# Patient Record
Sex: Female | Born: 1970 | Race: White | Hispanic: No | Marital: Single | Smoking: Former smoker
Health system: Southern US, Community
[De-identification: ages and names within clinical notes are randomized; demographics above are authoritative.]

## PROBLEM LIST (undated history)

## (undated) DIAGNOSIS — Z8719 Personal history of other diseases of the digestive system: Secondary | ICD-10-CM

## (undated) DIAGNOSIS — S199XXA Unspecified injury of neck, initial encounter: Secondary | ICD-10-CM

## (undated) DIAGNOSIS — Z8711 Personal history of peptic ulcer disease: Secondary | ICD-10-CM

## (undated) DIAGNOSIS — S92919A Unspecified fracture of unspecified toe(s), initial encounter for closed fracture: Secondary | ICD-10-CM

## (undated) HISTORY — PX: KNEE ARTHROSCOPY: SHX127

## (undated) HISTORY — PX: BREAST SURGERY: SHX581

## (undated) HISTORY — PX: WISDOM TOOTH EXTRACTION: SHX21

---

## 2006-03-25 ENCOUNTER — Inpatient Hospital Stay (HOSPITAL_COMMUNITY): Admission: AD | Admit: 2006-03-25 | Discharge: 2006-03-25 | Payer: Self-pay | Admitting: Gynecology

## 2008-11-12 ENCOUNTER — Emergency Department (HOSPITAL_COMMUNITY): Admission: EM | Admit: 2008-11-12 | Discharge: 2008-11-12 | Payer: Self-pay | Admitting: Emergency Medicine

## 2008-11-18 ENCOUNTER — Ambulatory Visit (HOSPITAL_COMMUNITY): Admission: RE | Admit: 2008-11-18 | Discharge: 2008-11-18 | Payer: Self-pay | Admitting: Emergency Medicine

## 2008-11-18 ENCOUNTER — Emergency Department (HOSPITAL_COMMUNITY): Admission: EM | Admit: 2008-11-18 | Discharge: 2008-11-18 | Payer: Self-pay | Admitting: Emergency Medicine

## 2008-11-18 ENCOUNTER — Ambulatory Visit: Payer: Self-pay | Admitting: Surgery

## 2012-09-17 ENCOUNTER — Encounter (HOSPITAL_COMMUNITY): Payer: Self-pay | Admitting: Emergency Medicine

## 2012-09-17 ENCOUNTER — Emergency Department (HOSPITAL_COMMUNITY)
Admission: EM | Admit: 2012-09-17 | Discharge: 2012-09-17 | Disposition: A | Discharge status: Home / Self Care | Payer: Medicaid Other | Attending: Emergency Medicine | Admitting: Emergency Medicine

## 2012-09-17 DIAGNOSIS — F17200 Nicotine dependence, unspecified, uncomplicated: Secondary | ICD-10-CM | POA: Insufficient documentation

## 2012-09-17 DIAGNOSIS — Y9389 Activity, other specified: Secondary | ICD-10-CM | POA: Insufficient documentation

## 2012-09-17 DIAGNOSIS — R519 Headache, unspecified: Secondary | ICD-10-CM

## 2012-09-17 DIAGNOSIS — Y9289 Other specified places as the place of occurrence of the external cause: Secondary | ICD-10-CM | POA: Insufficient documentation

## 2012-09-17 DIAGNOSIS — X503XXA Overexertion from repetitive movements, initial encounter: Secondary | ICD-10-CM | POA: Insufficient documentation

## 2012-09-17 DIAGNOSIS — S139XXA Sprain of joints and ligaments of unspecified parts of neck, initial encounter: Secondary | ICD-10-CM | POA: Insufficient documentation

## 2012-09-17 DIAGNOSIS — S161XXA Strain of muscle, fascia and tendon at neck level, initial encounter: Secondary | ICD-10-CM

## 2012-09-17 DIAGNOSIS — G43909 Migraine, unspecified, not intractable, without status migrainosus: Secondary | ICD-10-CM | POA: Insufficient documentation

## 2012-09-17 MED ORDER — SODIUM CHLORIDE 0.9 % IV BOLUS (SEPSIS)
2000.0000 mL | Freq: Once | INTRAVENOUS | Status: AC
  Administered 2012-09-17: 1000 mL via INTRAVENOUS

## 2012-09-17 MED ORDER — PREDNISONE 50 MG PO TABS: 50.0000 mg | ORAL_TABLET | Freq: Every day | ORAL | Status: DC

## 2012-09-17 MED ORDER — PROCHLORPERAZINE EDISYLATE 5 MG/ML IJ SOLN
10.0000 mg | Freq: Once | INTRAMUSCULAR | Status: AC
  Administered 2012-09-17: 10 mg via INTRAVENOUS
  Filled 2012-09-17: qty 2

## 2012-09-17 MED ORDER — KETOROLAC TROMETHAMINE 30 MG/ML IJ SOLN
30.0000 mg | Freq: Once | INTRAMUSCULAR | Status: AC
  Administered 2012-09-17: 30 mg via INTRAVENOUS
  Filled 2012-09-17: qty 1

## 2012-09-17 MED ORDER — CYCLOBENZAPRINE HCL 10 MG PO TABS: 10.0000 mg | ORAL_TABLET | Freq: Three times a day (TID) | ORAL | Status: DC | PRN

## 2012-09-17 MED ORDER — DIPHENHYDRAMINE HCL 50 MG/ML IJ SOLN
25.0000 mg | Freq: Once | INTRAMUSCULAR | Status: AC
  Administered 2012-09-17: 25 mg via INTRAVENOUS
  Filled 2012-09-17: qty 1

## 2012-09-17 MED ORDER — HYDROCODONE-ACETAMINOPHEN 5-325 MG PO TABS: 1.0000 | ORAL_TABLET | Freq: Four times a day (QID) | ORAL | Status: DC | PRN

## 2012-09-17 NOTE — ED Provider Notes (Signed)
CSN: [id number]     Arrival date & time 09/17/12  1843 History     First MD Initiated Contact with Patient 09/17/12 2044     Chief Complaint  Patient presents with  . Migraine  . Torticollis   (Consider location/radiation/quality/duration/timing/severity/associated sxs/prior Treatment) HPI Patient presents to the emergency department with headache and neck pain for the last 3 weeks.  The patient, states, that she was moving and surgery carrying boxes on her head.  She states that she had developing neck pain over the next few days.  Patient denies numbness, weakness, chest pain, shortness of breath, fever, blurred vision, nausea, vomiting, diarrhea, fever, or syncope.  Patient, states she does not take any medications prior to arrival.  Patient, states the headache starts in her neck and radiates around to her head. History reviewed. No pertinent past medical history. Past Surgical History  Procedure Laterality Date  . Knee arthroscopy     No family history on file. History  Substance Use Topics  . Smoking status: Current Every Day Smoker    Types: Cigarettes  . Smokeless tobacco: Not on file  . Alcohol Use: No   OB History   Grav Para Term Preterm Abortions TAB SAB Ect Mult Living                 Review of Systems All other systems negative except as documented in the HPI. All pertinent positives and negatives as reviewed in the HPI. Allergies  Flagyl  Home Medications   Current Outpatient Rx  Name  Route  Sig  Dispense  Refill  . acetaminophen (TYLENOL) 500 MG tablet   Oral   Take 1,500 mg by mouth every 6 (six) hours as needed for pain.          BP 108/77  Pulse 94  Temp(Src) 97.8 F (36.6 C) (Oral)  Resp 16  SpO2 100% Physical Exam  Nursing note and vitals reviewed. Constitutional: She is oriented to person, place, and time. She appears well-developed and well-nourished.  HENT:  Head: Normocephalic and atraumatic.  Mouth/Throat: Oropharynx is clear and  moist.  Eyes: Pupils are equal, round, and reactive to light.  Neck: Normal range of motion. Neck supple.  Cardiovascular: Normal rate, regular rhythm and normal heart sounds.  Exam reveals no gallop and no friction rub.   No murmur heard. Pulmonary/Chest: Effort normal and breath sounds normal. No respiratory distress.  Abdominal: Soft. Bowel sounds are normal. She exhibits no distension. There is no tenderness. There is no rebound.  Neurological: She is alert and oriented to person, place, and time. She has normal strength and normal reflexes. No sensory deficit. She exhibits normal muscle tone. Coordination and gait normal.  Reflex Scores:      Tricep reflexes are 2+ on the right side and 2+ on the left side.      Bicep reflexes are 2+ on the right side and 2+ on the left side. Skin: Skin is warm and dry. No rash noted. No erythema.    ED Course   Procedures (including critical care time) Patient has cervical strain and tension type headache, based on her history of present illness and physical exam findings.  Patient has no neurological deficits noted on exam.  She has normal neurological exam and reflexes in all 4 extremities.  Patient is advised to followup with her primary Dr. told to return here as needed.  For any worsening in her condition  MDM    Carlyle Dolly, PA-C  09/20/12 0716 

## 2012-09-17 NOTE — ED Notes (Signed)
Pt c/o of neck stiffness, migraine, nausea, vomiting. States that she was carrying boxes on her head while moving and since has had pain. Pain 10/10. Photosensitivity.

## 2012-09-17 NOTE — ED Notes (Signed)
Pt has been moving to a second story apartment for the past few days and she has been carrying boxes on her head. Pt states she has knots on her neck and on top of her shoulders. Pt has been having this migraine feeling the past few nights.

## 2012-09-26 NOTE — ED Provider Notes (Signed)
Medical screening examination/treatment/procedure(s) were performed by non-physician practitioner and as supervising physician I was immediately available for consultation/collaboration.  Romey Cohea N Dametrius Sanjuan, DO 09/26/12 1055 

## 2012-10-14 ENCOUNTER — Other Ambulatory Visit: Payer: Self-pay | Admitting: Family Medicine

## 2012-10-14 DIAGNOSIS — M797 Fibromyalgia: Secondary | ICD-10-CM

## 2012-10-14 DIAGNOSIS — M542 Cervicalgia: Secondary | ICD-10-CM

## 2012-10-15 ENCOUNTER — Telehealth: Payer: Self-pay | Admitting: Radiology

## 2012-10-15 NOTE — Telephone Encounter (Signed)
Patient has been ordered a CT scan by Dr Milus Glazier, there is nothing in the computer for her. She is a patient at his other office. The information regarding precert was sent to our office, I have forwarded this to the office on [facility] [address]. To you FYI

## 2012-10-16 ENCOUNTER — Inpatient Hospital Stay: Admission: RE | Admit: 2012-10-16 | Payer: Self-pay | Source: Ambulatory Visit

## 2012-10-17 ENCOUNTER — Other Ambulatory Visit: Payer: Self-pay

## 2012-10-21 ENCOUNTER — Other Ambulatory Visit: Payer: Self-pay | Admitting: Family Medicine

## 2012-10-22 ENCOUNTER — Other Ambulatory Visit: Payer: Medicaid Other

## 2012-10-23 ENCOUNTER — Other Ambulatory Visit: Payer: Medicaid Other

## 2012-11-16 ENCOUNTER — Emergency Department (HOSPITAL_COMMUNITY)
Admission: EM | Admit: 2012-11-16 | Discharge: 2012-11-16 | Disposition: A | Discharge status: Home / Self Care | Payer: Medicaid Other | Source: Home / Self Care | Attending: Family Medicine | Admitting: Family Medicine

## 2012-11-16 ENCOUNTER — Emergency Department (INDEPENDENT_AMBULATORY_CARE_PROVIDER_SITE_OTHER): Payer: Medicaid Other

## 2012-11-16 ENCOUNTER — Encounter (HOSPITAL_COMMUNITY): Payer: Self-pay | Admitting: Emergency Medicine

## 2012-11-16 DIAGNOSIS — L089 Local infection of the skin and subcutaneous tissue, unspecified: Secondary | ICD-10-CM

## 2012-11-16 MED ORDER — DOXYCYCLINE HYCLATE 100 MG PO CAPS: 100.0000 mg | ORAL_CAPSULE | Freq: Two times a day (BID) | ORAL | Status: DC

## 2012-11-16 NOTE — ED Notes (Signed)
Pt c/o right hand/pinky inj onset yest around 1700 Reports she jammed her pinky against working [employer] include: swelling, pain Taking tyle and warms soaks w/no relief.

## 2012-11-16 NOTE — ED Notes (Signed)
Applied telfa to finger and wrapped w/1' kling and 1' coban... Buddy taped pinky w/ring finger.

## 2012-11-16 NOTE — ED Provider Notes (Signed)
CSN: [id number]     Arrival date & time 11/16/12  4098 History   None    Chief Complaint  Patient presents with  . Hand Injury   (Consider location/radiation/quality/duration/timing/severity/associated sxs/prior Treatment) HPI Comments: 42 year old female presents complaining of right pinky finger injury yesterday at 5:00 PM. She was working and jammed her finger against a van. She has pain and swelling, with a constant burning sensation of the finger. She states the pain is so severe that it kept her up all night long. She denies any other injury. Denies any numbness in the finger.  Patient is a 42 y.o. female presenting with hand injury.  Hand Injury Associated symptoms: no fever     History reviewed. No pertinent past medical history. Past Surgical History  Procedure Laterality Date  . Knee arthroscopy     No family history on file. History  Substance Use Topics  . Smoking status: Current Every Day Smoker -- 0.50 packs/day    Types: Cigarettes  . Smokeless tobacco: Not on file  . Alcohol Use: No   OB History   Grav Para Term Preterm Abortions TAB SAB Ect Mult Living                 Review of Systems  Constitutional: Negative for fever and chills.  Eyes: Negative for visual disturbance.  Respiratory: Negative for cough and shortness of breath.   Cardiovascular: Negative for chest pain, palpitations and leg swelling.  Gastrointestinal: Negative for nausea, vomiting and abdominal pain.  Endocrine: Negative for polydipsia and polyuria.  Genitourinary: Negative for dysuria, urgency and frequency.  Musculoskeletal:       See history of present illness  Skin: Negative for rash.  Neurological: Negative for dizziness, weakness and light-headedness.    Allergies  Flagyl  Home Medications   Current Outpatient Rx  Name  Route  Sig  Dispense  Refill  . acetaminophen (TYLENOL) 500 MG tablet   Oral   Take 1,500 mg by mouth every 6 (six) hours as needed for pain.         . cyclobenzaprine (FLEXERIL) 10 MG tablet   Oral   Take 1 tablet (10 mg total) by mouth 3 (three) times daily as needed for muscle spasms.   15 tablet   0   . doxycycline (VIBRAMYCIN) 100 MG capsule   Oral   Take 1 capsule (100 mg total) by mouth 2 (two) times daily.   20 capsule   0   . HYDROcodone-acetaminophen (NORCO/VICODIN) 5-325 MG per tablet   Oral   Take 1 tablet by mouth every 6 (six) hours as needed for pain.   15 tablet   0   . predniSONE (DELTASONE) 50 MG tablet   Oral   Take 1 tablet (50 mg total) by mouth daily.   7 tablet   0    BP 97/67  Pulse 111  Temp(Src) 98.1 F (36.7 C) (Oral)  Resp 18  SpO2 98%  LMP 10/26/2012 Physical Exam  Nursing note and vitals reviewed. Constitutional: She is oriented to person, place, and time. Vital signs are normal. She appears well-developed and well-nourished. No distress.  HENT:  Head: Normocephalic and atraumatic.  Pulmonary/Chest: Effort normal. No respiratory distress.  Musculoskeletal:       Hands: Neurological: She is alert and oriented to person, place, and time. She has normal strength. Coordination normal.  Skin: Skin is warm and dry. No rash noted. She is not diaphoretic.  Psychiatric: She has a normal mood  and affect. Judgment normal.    ED Course  Procedures (including critical care time) Labs Review Labs Reviewed - No data to display Imaging Review Dg Finger Little Right  11/16/2012   CLINICAL DATA:  Injured finger.  EXAM: RIGHT LITTLE FINGER 2+V  COMPARISON:  None.  FINDINGS: The joint spaces are maintained.  No acute fracture or dislocation.  IMPRESSION: No acute bony findings.   Electronically Signed   By: Loralie Champagne M.D.   On: 11/16/2012 11:30    Digital block with a single volar Injection at the base of the little finger with 3 mL of 2% lidocaine with epinephrine. Paronychia drained. A single straight Incision into the distal pad of the finger with an 11 blade, no drainable abscess was  located. Dressed, buddy taped. Followup in 3 days per  MDM   1. Finger infection    Putting patient on oral antibiotics. Ibuprofen for pain as needed. After reviewing the prescription drug database, she has just filled a prescription for 90 Oxycodone 8 days ago, which she was untruthful about.       Graylon Good, PA-C 11/16/12 1227

## 2012-11-18 NOTE — ED Provider Notes (Signed)
Medical screening examination/treatment/procedure(s) were performed by a resident physician or non-physician practitioner and as the supervising physician I was immediately available for consultation/collaboration.  Evan Corey, MD    Evan S Corey, MD 11/18/12 0945 

## 2012-12-05 ENCOUNTER — Other Ambulatory Visit: Payer: Self-pay

## 2012-12-05 ENCOUNTER — Other Ambulatory Visit: Payer: Self-pay | Admitting: Family Medicine

## 2012-12-06 ENCOUNTER — Telehealth: Payer: Self-pay

## 2012-12-06 NOTE — Telephone Encounter (Signed)
Called pharmacy and asked them to send their request for valium to Dr L's other office and gave fax #. Dr L had printed off the Rx through [facility] and this is not our pt. I shredded the Rx. Dr Elbert Ewings, [place] Childes

## 2012-12-14 ENCOUNTER — Encounter (HOSPITAL_COMMUNITY): Payer: Self-pay | Admitting: Emergency Medicine

## 2012-12-14 ENCOUNTER — Emergency Department (HOSPITAL_COMMUNITY)
Admission: EM | Admit: 2012-12-14 | Discharge: 2012-12-14 | Disposition: A | Discharge status: Home / Self Care | Payer: Medicaid Other | Attending: Emergency Medicine | Admitting: Emergency Medicine

## 2012-12-14 DIAGNOSIS — F17200 Nicotine dependence, unspecified, uncomplicated: Secondary | ICD-10-CM | POA: Insufficient documentation

## 2012-12-14 DIAGNOSIS — M549 Dorsalgia, unspecified: Secondary | ICD-10-CM | POA: Insufficient documentation

## 2012-12-14 DIAGNOSIS — Z79899 Other long term (current) drug therapy: Secondary | ICD-10-CM | POA: Insufficient documentation

## 2012-12-14 DIAGNOSIS — IMO0002 Reserved for concepts with insufficient information to code with codable children: Secondary | ICD-10-CM | POA: Insufficient documentation

## 2012-12-14 DIAGNOSIS — Z792 Long term (current) use of antibiotics: Secondary | ICD-10-CM | POA: Insufficient documentation

## 2012-12-14 HISTORY — DX: Personal history of other diseases of the digestive system: Z87.19

## 2012-12-14 HISTORY — DX: Unspecified injury of neck, initial encounter: S19.9XXA

## 2012-12-14 HISTORY — DX: Personal history of peptic ulcer disease: Z87.11

## 2012-12-14 HISTORY — DX: Unspecified fracture of unspecified toe(s), initial encounter for closed fracture: S92.919A

## 2012-12-14 MED ORDER — OXYCODONE-ACETAMINOPHEN 5-325 MG PO TABS: 2.0000 | ORAL_TABLET | ORAL | Status: DC | PRN

## 2012-12-14 MED ORDER — DIAZEPAM 5 MG PO TABS: 5.0000 mg | ORAL_TABLET | Freq: Two times a day (BID) | ORAL | Status: DC

## 2012-12-14 NOTE — ED Provider Notes (Signed)
Medical screening examination/treatment/procedure(s) were performed by non-physician practitioner and as supervising physician I was immediately available for consultation/collaboration.  EKG Interpretation   None          Mateen Franssen B. Bernette Mayers, MD 12/14/12 [phone]

## 2012-12-14 NOTE — ED Provider Notes (Signed)
CSN: [id number]     Arrival date & time 12/14/12  6578 History   None    No chief complaint on file.  (Consider location/radiation/quality/duration/timing/severity/associated sxs/prior Treatment) Patient is a 42 y.o. female presenting with neck pain. The history is provided by the patient. No language interpreter was used.  Neck Pain Pain location:  Generalized neck Quality:  Aching Pain radiates to:  Does not radiate Pain severity:  Severe Pain is:  Unable to specify Onset quality:  Sudden Timing:  Constant Progression:  Worsening Chronicity:  New Relieved by:  Nothing Worsened by:  Nothing tried Ineffective treatments:  None tried   No past medical history on file. Past Surgical History  Procedure Laterality Date  . Knee arthroscopy     No family history on file. History  Substance Use Topics  . Smoking status: Current Every Day Smoker -- 0.50 packs/day    Types: Cigarettes  . Smokeless tobacco: Not on file  . Alcohol Use: No   OB History   Grav Para Term Preterm Abortions TAB SAB Ect Mult Living                 Review of Systems  Musculoskeletal: Positive for neck pain.  All other systems reviewed and are negative.    Allergies  Flagyl  Home Medications   Current Outpatient Rx  Name  Route  Sig  Dispense  Refill  . acetaminophen (TYLENOL) 500 MG tablet   Oral   Take 1,500 mg by mouth every 6 (six) hours as needed for pain.         . cyclobenzaprine (FLEXERIL) 10 MG tablet   Oral   Take 1 tablet (10 mg total) by mouth 3 (three) times daily as needed for muscle spasms.   15 tablet   0   . diazepam (VALIUM) 5 MG tablet      TAKE ONE TABLET BY MOUTH TWICE DAILY AS NEEDED    60 tablet   1   . doxycycline (VIBRAMYCIN) 100 MG capsule   Oral   Take 1 capsule (100 mg total) by mouth 2 (two) times daily.   20 capsule   0   . HYDROcodone-acetaminophen (NORCO/VICODIN) 5-325 MG per tablet   Oral   Take 1 tablet by mouth every 6 (six) hours as  needed for pain.   15 tablet   0   . predniSONE (DELTASONE) 50 MG tablet   Oral   Take 1 tablet (50 mg total) by mouth daily.   7 tablet   0    LMP 10/26/2012 Physical Exam  Nursing note and vitals reviewed. Constitutional: She appears well-developed and well-nourished.  HENT:  Head: Normocephalic and atraumatic.  Eyes: Conjunctivae are normal. Pupils are equal, round, and reactive to light.  Neck: Normal range of motion. Neck supple.  Cardiovascular: Normal rate.   Pulmonary/Chest: Effort normal.  Abdominal: Soft. Bowel sounds are normal.  Musculoskeletal: Normal range of motion.  Neurological: She is alert.  Skin: Skin is warm.    ED Course  Procedures (including critical care time) Labs Review Labs Reviewed - No data to display Imaging Review No results found.  EKG Interpretation   None       MDM  No diagnosis found.  Valium  Percocet    Pt given small supply and advised to see her MD for recheck  Elson Areas, PA-C 12/14/12 0720  Lonia Skinner Coats, [place] 12/14/12 [phone]

## 2012-12-14 NOTE — ED Notes (Addendum)
Pt from home with c/o of neck and head pain since this past July requesting pain relief.  Pt has run out of pain medication and has been back and forth between her PCP and orthopedist to help with her pain.  She has a MRI scheduled in 2 weeks with likely surgery.  Increasing bilateral leg weakness. Pt in NAD, A&O.

## 2012-12-29 ENCOUNTER — Encounter (HOSPITAL_COMMUNITY): Payer: Self-pay | Admitting: Emergency Medicine

## 2012-12-29 ENCOUNTER — Emergency Department (HOSPITAL_COMMUNITY)
Admission: EM | Admit: 2012-12-29 | Discharge: 2012-12-29 | Disposition: A | Discharge status: Home / Self Care | Payer: Medicaid Other | Attending: Emergency Medicine | Admitting: Emergency Medicine

## 2012-12-29 DIAGNOSIS — R0789 Other chest pain: Secondary | ICD-10-CM | POA: Insufficient documentation

## 2012-12-29 DIAGNOSIS — Z8679 Personal history of other diseases of the circulatory system: Secondary | ICD-10-CM | POA: Insufficient documentation

## 2012-12-29 DIAGNOSIS — Z79899 Other long term (current) drug therapy: Secondary | ICD-10-CM | POA: Insufficient documentation

## 2012-12-29 DIAGNOSIS — M5020 Other cervical disc displacement, unspecified cervical region: Secondary | ICD-10-CM | POA: Insufficient documentation

## 2012-12-29 DIAGNOSIS — G8929 Other chronic pain: Secondary | ICD-10-CM | POA: Insufficient documentation

## 2012-12-29 DIAGNOSIS — M25519 Pain in unspecified shoulder: Secondary | ICD-10-CM | POA: Insufficient documentation

## 2012-12-29 DIAGNOSIS — IMO0002 Reserved for concepts with insufficient information to code with codable children: Secondary | ICD-10-CM

## 2012-12-29 DIAGNOSIS — R5381 Other malaise: Secondary | ICD-10-CM | POA: Insufficient documentation

## 2012-12-29 DIAGNOSIS — F17200 Nicotine dependence, unspecified, uncomplicated: Secondary | ICD-10-CM | POA: Insufficient documentation

## 2012-12-29 DIAGNOSIS — M542 Cervicalgia: Secondary | ICD-10-CM | POA: Insufficient documentation

## 2012-12-29 MED ORDER — CYCLOBENZAPRINE HCL 10 MG PO TABS: 10.0000 mg | ORAL_TABLET | Freq: Three times a day (TID) | ORAL | Status: DC | PRN

## 2012-12-29 MED ORDER — OXYCODONE-ACETAMINOPHEN 5-325 MG PO TABS: 1.0000 | ORAL_TABLET | Freq: Four times a day (QID) | ORAL | Status: DC | PRN

## 2012-12-29 NOTE — ED Notes (Signed)
Pt reports chronic upper back pain and has had an MRI and is scheduled to see pain management on 12/9 but pain is too much to wait and she took last of her pain meds this am.

## 2012-12-29 NOTE — ED Provider Notes (Signed)
CSN: [id number]     Arrival date & time 12/29/12  1105 History   None     This chart was scribed for non-physician practitioner, Lowella Dell PA-C working with Gwyneth Sprout, MD by Arlan Organ, ED Scribe. This patient was seen in room TR09C/TR09C and the patient's care was started at 11:50 AM.    Chief Complaint  Patient presents with  . Back Pain   The history is provided by the patient. No language interpreter was used.   HPI Comments: Kimberly Hubbard is a 42 y.o. Female with a h/o of migraines and back pain who presents to the Emergency Department complaining of a new episode of gradual onset, constant back pain that has worsened in the last week.  She also reports pain to her right shoulder that she says started 5 days ago. She denies any recent injury. Pt states nothing makes the pain better or worse. She describes the pain as "throbbing and burning" rated 10/10, and reports centralized CP when the pain comes. Pain made worse with activity, does not seem to improve with rest. She has tried tylenol, heat, ice, and motrin with mild relief. She says she has had an MRI recently, and is seeking treatment from a pain management clinic. She states she is currently out of pain medication and she has an appointment on 01/07/13. She feels her pain is too severe to wait until her appointment. 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 Pt states she noted the pain shortly after her last heavy day of grooming at her job. She denies SOB, bladder or urinary changes, nausea, emesis, lower extremity pain  Patient Paper work brought in from [facility] Exam Date : 12/23/2012 Study: MRI Cervical Spine Impression: Moderate disc bulge C4-5 without significant central calal stenosis or cord compression.    Past Medical History  Diagnosis Date  . Neck injury   . History of stomach ulcers   . Toe fracture    Past Surgical History  Procedure Laterality  Date  . Knee arthroscopy    . Breast surgery    . Wisdom tooth extraction     History reviewed. No pertinent family history. History  Substance Use Topics  . Smoking status: Current Every Day Smoker -- 0.25 packs/day    Types: Cigarettes  . Smokeless tobacco: Never Used  . Alcohol Use: No   OB History   Grav Para Term Preterm Abortions TAB SAB Ect Mult Living                 Review of Systems  Constitutional: Negative for fever, chills and diaphoresis.  Respiratory: Negative for chest tightness and shortness of breath.   Cardiovascular: Positive for chest pain. Negative for palpitations and leg swelling.  Gastrointestinal: Negative for nausea, vomiting and abdominal pain.  Genitourinary: Negative for enuresis and difficulty urinating.  Musculoskeletal: Positive for arthralgias (Right shoulder pain), back pain, neck pain and neck stiffness.  Skin: Negative for rash.  Neurological: Positive for weakness. Negative for dizziness and numbness.  All other systems reviewed and are negative.    Allergies  Flagyl  Home Medications   Current Outpatient Rx  Name  Route  Sig  Dispense  Refill  . acetaminophen (TYLENOL) 500 MG tablet   Oral   Take 1,500 mg by mouth every 6 (six) hours as needed for pain.         . cyclobenzaprine (FLEXERIL) 10 MG tablet  Oral   Take 1 tablet (10 mg total) by mouth 3 (three) times daily as needed for muscle spasms.   15 tablet   0   . diazepam (VALIUM) 5 MG tablet   Oral   Take 1 tablet (5 mg total) by mouth 2 (two) times daily.   10 tablet   0   . HYDROcodone-acetaminophen (NORCO/VICODIN) 5-325 MG per tablet   Oral   Take 1 tablet by mouth every 6 (six) hours as needed for pain.   15 tablet   0   . oxyCODONE-acetaminophen (PERCOCET/ROXICET) 5-325 MG per tablet   Oral   Take 2 tablets by mouth every 4 (four) hours as needed for severe pain.   16 tablet   0   . cyclobenzaprine (FLEXERIL) 10 MG tablet   Oral   Take 1 tablet (10  mg total) by mouth 3 (three) times daily as needed for muscle spasms.   30 tablet   0   . oxyCODONE-acetaminophen (PERCOCET/ROXICET) 5-325 MG per tablet   Oral   Take 1-2 tablets by mouth every 6 (six) hours as needed for moderate pain or severe pain.   20 tablet   0    Triage Vitals: BP 111/82  Pulse 98  Temp(Src) 97.9 F (36.6 C) (Oral)  Resp 18  Wt 149 lb 14.4 oz (67.994 kg)  SpO2 99%  LMP 11/19/2012  Physical Exam  Nursing note and vitals reviewed. Constitutional: She is oriented to person, place, and time. She appears well-developed and well-nourished. She appears distressed.  HENT:  Head: Normocephalic and atraumatic.  Eyes: EOM are normal.  Neck: Spinous process tenderness and muscular tenderness present. Decreased range of motion present.    AROM limited secondary to pain with head rotation to left. ROM otherwise normal. Patient exhibit straight neck with forward head position and poor posture.   Cardiovascular: Normal rate, regular rhythm and normal heart sounds.  Exam reveals no gallop and no friction rub.   No murmur heard. Pulmonary/Chest: Effort normal and breath sounds normal. She has no wheezes. She has no rales. She exhibits no tenderness.  Neurological: She is alert and oriented to person, place, and time. She has normal strength. No cranial nerve deficit or sensory deficit.  Reflex Scores:      Bicep reflexes are 2+ on the right side and 2+ on the left side.      Patellar reflexes are 2+ on the right side and 2+ on the left side. Spurling's Compression test of Cervical spine illicits sharp burning pain to right shoulder.    Skin: Skin is warm and dry. She is not diaphoretic.  Psychiatric: Her behavior is normal. Her mood appears anxious.    ED Course  Procedures (including critical care time)  DIAGNOSTIC STUDIES: Oxygen Saturation is 99% on RA, normal by my interpretation.    COORDINATION OF CARE: 12:03 PM-Discussed treatment plan with pt at bedside  and pt agreed to plan.     Labs Review Labs Reviewed - No data to display Imaging Review No results found.  EKG Interpretation   None       MDM   1. Cervicalgia   2. Bulging disc    Patient with hx of chronic back pain presents with cervicalgia with radiation to right shoulder. Patient seen by Tomasita Crumble. Cervical MRI shows bulging disc at C4-5 without cord compression. Patient denies recent hx of trauma or injury. No hx of cardiac conditions. Vital signs WNL. Patient scheduled for follow up with  Ortho and Pain management. Advised patient to keep follow up with Ortho and Pain management. Educated patient about poor posture. Recommend supportive therapy with back/neck exercises/stretches and heat application. Given educational handout. Patient happy with plan. Discharged with rx for pain medication and instructions for followup.   I personally performed the services described in this documentation, which was scribed in my presence. The recorded information has been reviewed and is accurate.   Rudene Anda, PA-C 12/29/12 2306

## 2012-12-30 NOTE — ED Provider Notes (Signed)
Medical screening examination/treatment/procedure(s) were performed by non-physician practitioner and as supervising physician I was immediately available for consultation/collaboration.  EKG Interpretation   None         Gwyneth Sprout, MD 12/30/12 [phone]

## 2013-01-13 ENCOUNTER — Encounter (HOSPITAL_COMMUNITY): Payer: Self-pay | Admitting: Emergency Medicine

## 2013-01-13 ENCOUNTER — Emergency Department (HOSPITAL_COMMUNITY)
Admission: EM | Admit: 2013-01-13 | Discharge: 2013-01-13 | Disposition: A | Discharge status: Home / Self Care | Payer: Medicaid Other | Attending: Emergency Medicine | Admitting: Emergency Medicine

## 2013-01-13 DIAGNOSIS — IMO0002 Reserved for concepts with insufficient information to code with codable children: Secondary | ICD-10-CM | POA: Insufficient documentation

## 2013-01-13 DIAGNOSIS — Z79899 Other long term (current) drug therapy: Secondary | ICD-10-CM | POA: Insufficient documentation

## 2013-01-13 DIAGNOSIS — M7989 Other specified soft tissue disorders: Secondary | ICD-10-CM | POA: Insufficient documentation

## 2013-01-13 DIAGNOSIS — Z8719 Personal history of other diseases of the digestive system: Secondary | ICD-10-CM | POA: Insufficient documentation

## 2013-01-13 DIAGNOSIS — Z881 Allergy status to other antibiotic agents status: Secondary | ICD-10-CM | POA: Insufficient documentation

## 2013-01-13 DIAGNOSIS — R109 Unspecified abdominal pain: Secondary | ICD-10-CM | POA: Insufficient documentation

## 2013-01-13 DIAGNOSIS — Z3202 Encounter for pregnancy test, result negative: Secondary | ICD-10-CM | POA: Insufficient documentation

## 2013-01-13 DIAGNOSIS — M546 Pain in thoracic spine: Secondary | ICD-10-CM | POA: Insufficient documentation

## 2013-01-13 DIAGNOSIS — M25579 Pain in unspecified ankle and joints of unspecified foot: Secondary | ICD-10-CM | POA: Insufficient documentation

## 2013-01-13 DIAGNOSIS — N898 Other specified noninflammatory disorders of vagina: Secondary | ICD-10-CM | POA: Insufficient documentation

## 2013-01-13 DIAGNOSIS — R319 Hematuria, unspecified: Secondary | ICD-10-CM | POA: Insufficient documentation

## 2013-01-13 DIAGNOSIS — F17200 Nicotine dependence, unspecified, uncomplicated: Secondary | ICD-10-CM | POA: Insufficient documentation

## 2013-01-13 LAB — URINE MICROSCOPIC-ADD ON

## 2013-01-13 LAB — WET PREP, GENITAL

## 2013-01-13 LAB — COMPREHENSIVE METABOLIC PANEL
ALT: 16 U/L (ref 0–35)
AST: 22 U/L (ref 0–37)
Alkaline Phosphatase: 58 U/L (ref 39–117)
GFR calc non Af Amer: >90 mL/min (ref >90)
Glucose, Bld: 86 mg/dL (ref 70–99)
Potassium: 4 mEq/L (ref 3.5–5.1)

## 2013-01-13 LAB — CBC
MCHC: 34.7 g/dL (ref 30.0–36.0)
MCV: 97.1 fL (ref 78.0–100.0)
WBC: 7.2 10*3/uL (ref 4.0–10.5)

## 2013-01-13 LAB — URINALYSIS, ROUTINE W REFLEX MICROSCOPIC
Ketones, ur: NEGATIVE mg/dL
Protein, ur: NEGATIVE mg/dL
Urobilinogen, UA: 0.2 mg/dL (ref 0.0–1.0)

## 2013-01-13 MED ORDER — KETOROLAC TROMETHAMINE 30 MG/ML IJ SOLN
30.0000 mg | Freq: Once | INTRAMUSCULAR | Status: DC
  Filled 2013-01-13: qty 1

## 2013-01-13 MED ORDER — HYDROMORPHONE HCL PF 1 MG/ML IJ SOLN
1.0000 mg | Freq: Once | INTRAMUSCULAR | Status: AC
  Administered 2013-01-13: 1 mg via INTRAMUSCULAR

## 2013-01-13 MED ORDER — DIAZEPAM 5 MG/ML IJ SOLN: 5.0000 mg | Freq: Once | INTRAMUSCULAR | Status: DC

## 2013-01-13 MED ORDER — KETOROLAC TROMETHAMINE 60 MG/2ML IM SOLN
60.0000 mg | Freq: Once | INTRAMUSCULAR | Status: AC
  Administered 2013-01-13: 60 mg via INTRAMUSCULAR
  Filled 2013-01-13: qty 2

## 2013-01-13 MED ORDER — DIAZEPAM 5 MG/ML IJ SOLN
10.0000 mg | Freq: Once | INTRAMUSCULAR | Status: DC
  Filled 2013-01-13: qty 2

## 2013-01-13 MED ORDER — HYDROMORPHONE HCL PF 1 MG/ML IJ SOLN
1.0000 mg | Freq: Once | INTRAMUSCULAR | Status: DC
  Filled 2013-01-13: qty 1

## 2013-01-13 MED ORDER — CYCLOBENZAPRINE HCL 5 MG PO TABS
7.5000 mg | ORAL_TABLET | Freq: Once | ORAL | Status: AC
  Administered 2013-01-13: 7.5 mg via ORAL
  Filled 2013-01-13: qty 1.5

## 2013-01-13 MED ORDER — CYCLOBENZAPRINE HCL 10 MG PO TABS: 10.0000 mg | ORAL_TABLET | Freq: Two times a day (BID) | ORAL | Status: DC | PRN

## 2013-01-13 NOTE — ED Notes (Addendum)
Back pain for a while worse since moving dog yesterday and is now telling me chest pain started last night  Both feet are swelling t started his am also having heavy vag d/c  X 6 months and blood in urine

## 2013-01-13 NOTE — ED Notes (Signed)
Per pt, herniated disk, DDD, bone problems. Pt states she was working yesterday, lifted something, "something happen in back, really really painful, pain in middle back. Hurts with palpation. Pt states this morning she woke up, "feet were swollen up like balloons", states she slept with her feet elevated. Denies heart problems.

## 2013-01-13 NOTE — ED Notes (Signed)
Pt also states when she peed this morning, it was red, states there was blood in urine. Pt states shes also been having heavy discharge. Denies burning with urination.

## 2013-01-13 NOTE — ED Notes (Signed)
Attempted to start IV x2, both times pt screamed and pulled arm away with needle still in arm. IV team paged.

## 2013-01-13 NOTE — ED Provider Notes (Signed)
CSN: [id number]     Arrival date & time 01/13/13  1344 History   First MD Initiated Contact with Patient 01/13/13 1737     Chief Complaint  Patient presents with  . Back Pain  . 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  Similar to previous but more severe. Pt on oxycodone every 12 hrs, recently decreased with new pain doctor.  No weakness or numbness.  Mild lower extremity swelling worsening the past few days.  No kidney or liver or heart hx.  No recent surgery, active CA, leg pain.    Patient is a 42 y.o. female presenting with back pain and lower extremity pain. The history is provided by the patient.  Back Pain Associated symptoms: no abdominal pain, no chest pain, no dysuria, no fever and no headaches   Foot Pain Pertinent negatives include no chest pain, no abdominal pain, no headaches and no shortness of breath.    Past Medical History  Diagnosis Date  . Neck injury   . History of stomach ulcers   . Toe fracture    Past Surgical History  Procedure Laterality Date  . Knee arthroscopy    . Breast surgery    . Wisdom tooth extraction     No family history on file. History  Substance Use Topics  . Smoking status: Current Every Day Smoker -- 0.25 packs/day    Types: Cigarettes  . Smokeless tobacco: Never Used  . Alcohol Use: No   OB History   Grav Para Term Preterm Abortions TAB SAB Ect Mult Living                 Review of Systems  Constitutional: Negative for fever and chills.  HENT: Negative for congestion.   Eyes: Negative for visual disturbance.  Respiratory: Negative for shortness of breath.   Cardiovascular: Positive for leg swelling. Negative  for chest pain.  Gastrointestinal: Negative for vomiting and abdominal pain.  Genitourinary: Positive for hematuria, flank pain and vaginal discharge. Negative for dysuria.  Musculoskeletal: Positive for back pain. Negative for neck pain and neck stiffness.  Skin: Negative for rash.  Neurological: Negative for light-headedness and headaches.    Allergies  Flagyl  Home Medications   Current Outpatient Rx  Name  Route  Sig  Dispense  Refill  . amphetamine-dextroamphetamine (ADDERALL) 30 MG tablet   Oral   Take 30 mg by mouth 2 (two) times daily.         . diazepam (VALIUM) 5 MG tablet   Oral   Take 1 tablet (5 mg total) by mouth 2 (two) times daily.   10 tablet   0   . gabapentin (NEURONTIN) 300 MG capsule   Oral   Take 300 mg by mouth 2 (two) times daily.         Marland Kitchen oxycodone (OXY-IR) 5 MG capsule   Oral   Take 5 mg by mouth every 12 (twelve) hours.          BP 129/90  Pulse 87  Temp(Src) 97.4 F (36.3 C) (Oral)  Resp 13  SpO2 100%  LMP 11/19/2012 Physical Exam  Nursing note and vitals reviewed. Constitutional: She is oriented to person, place, and time. She appears well-developed and well-nourished.  HENT:  Head: Normocephalic and atraumatic.  Eyes: Conjunctivae are normal. Pupils are equal, round, and reactive to light. Right eye exhibits no discharge. Left eye exhibits no discharge.  Neck: Normal range of motion. Neck supple. No tracheal deviation present.  Cardiovascular: Normal rate and regular rhythm.   Pulmonary/Chest: Effort normal and breath sounds normal.  Abdominal: Soft. She exhibits no distension. There is no tenderness. There is no guarding.  Genitourinary:  Mild white vaginal dc, no cmt, os closed  Musculoskeletal: She exhibits no edema.  Left mid thoracic worse paraspinal, no step off or focal vertebral tender, ight musculature left paraspinal, full rom of cervical, no midline cervical tenderness  Neurological: She is alert and oriented to  person, place, and time. GCS eye subscore is 4. GCS verbal subscore is 5. GCS motor subscore is 6.  Reflex Scores:      Patellar reflexes are 2+ on the right side and 2+ on the left side.      Achilles reflexes are 1+ on the right side and 1+ on the left side. 5+ strength in UE and LE with f/e at major joints. Sensation to palpation intact in UE and LE. CNs 2-12 grossly intact.  EOMFI.  PERRL.   Visual fields intact to finger testing.   Skin: Skin is warm. No rash noted.  Psychiatric: She has a normal mood and affect.    ED Course  Procedures (including critical care time) Labs Review Labs Reviewed  WET PREP, GENITAL - Abnormal; Notable for the following:    Clue Cells Wet Prep HPF POC FEW (*)    WBC, Wet Prep HPF POC FEW (*)    All other components within normal limits  CBC - Abnormal; Notable for the following:    RBC 3.80 (*)    All other components within normal limits  URINALYSIS, ROUTINE W REFLEX MICROSCOPIC - Abnormal; Notable for the following:    Leukocytes, UA TRACE (*)    All other components within normal limits  GC/CHLAMYDIA PROBE AMP  COMPREHENSIVE METABOLIC PANEL  PREGNANCY, URINE  URINE MICROSCOPIC-ADD ON   Imaging Review No results found.  EKG Interpretation   None       MDM   1. Acute thoracic back pain   2. Vaginal discharge    Hematuria and vag discharge - UA and pelvic/ labs Back pain- acute on chronic - meds.  Pt recently had mri, no injuries. Leg swelling bilateral, mild- check liver and kidney function Normal neuro exam.  Pt improved in ED. No CMT.  Mild discharge, pt [facility].  Results and differential diagnosis were discussed with the patient. Close follow up outpatient was discussed, patient comfortable with the plan.   Diagnosis: above   Enid Skeens, MD 01/13/13 2128

## 2013-01-14 LAB — GC/CHLAMYDIA PROBE AMP: GC Probe RNA: NEGATIVE

## 2013-02-17 ENCOUNTER — Telehealth (HOSPITAL_COMMUNITY): Payer: Self-pay

## 2013-02-17 NOTE — Telephone Encounter (Signed)
Called patient back to follow up on 02/17/13, and she had already spoken to someone about her lab results.

## 2013-09-17 ENCOUNTER — Emergency Department (HOSPITAL_COMMUNITY)
Admission: EM | Admit: 2013-09-17 | Discharge: 2013-09-17 | Disposition: A | Discharge status: Home / Self Care | Payer: Medicaid Other | Source: Home / Self Care | Attending: Emergency Medicine | Admitting: Emergency Medicine

## 2013-09-17 ENCOUNTER — Encounter (HOSPITAL_COMMUNITY): Payer: Self-pay | Admitting: Emergency Medicine

## 2013-09-17 ENCOUNTER — Emergency Department (HOSPITAL_COMMUNITY)
Admission: EM | Admit: 2013-09-17 | Discharge: 2013-09-18 | Disposition: A | Discharge status: Home / Self Care | Payer: Medicaid Other | Attending: Emergency Medicine | Admitting: Emergency Medicine

## 2013-09-17 DIAGNOSIS — Z4801 Encounter for change or removal of surgical wound dressing: Secondary | ICD-10-CM | POA: Diagnosis present

## 2013-09-17 DIAGNOSIS — R0602 Shortness of breath: Secondary | ICD-10-CM

## 2013-09-17 DIAGNOSIS — Z79899 Other long term (current) drug therapy: Secondary | ICD-10-CM | POA: Insufficient documentation

## 2013-09-17 DIAGNOSIS — L02413 Cutaneous abscess of right upper limb: Secondary | ICD-10-CM

## 2013-09-17 DIAGNOSIS — L989 Disorder of the skin and subcutaneous tissue, unspecified: Secondary | ICD-10-CM | POA: Diagnosis not present

## 2013-09-17 DIAGNOSIS — F17200 Nicotine dependence, unspecified, uncomplicated: Secondary | ICD-10-CM

## 2013-09-17 DIAGNOSIS — L988 Other specified disorders of the skin and subcutaneous tissue: Secondary | ICD-10-CM | POA: Insufficient documentation

## 2013-09-17 DIAGNOSIS — IMO0002 Reserved for concepts with insufficient information to code with codable children: Secondary | ICD-10-CM | POA: Diagnosis not present

## 2013-09-17 DIAGNOSIS — Z87828 Personal history of other (healed) physical injury and trauma: Secondary | ICD-10-CM

## 2013-09-17 DIAGNOSIS — Z8781 Personal history of (healed) traumatic fracture: Secondary | ICD-10-CM | POA: Insufficient documentation

## 2013-09-17 LAB — GRAM STAIN
GRAM STAIN: NONE SEEN
Special Requests: NORMAL

## 2013-09-17 MED ORDER — DOXYCYCLINE HYCLATE 100 MG PO CAPS: 100.0000 mg | ORAL_CAPSULE | Freq: Two times a day (BID) | ORAL | Status: DC

## 2013-09-17 MED ORDER — CHLORHEXIDINE GLUCONATE 2 % EX SOLN: CUTANEOUS | Status: DC

## 2013-09-17 MED ORDER — HYDROCODONE-ACETAMINOPHEN 5-325 MG PO TABS: 1.0000 | ORAL_TABLET | ORAL | Status: DC | PRN

## 2013-09-17 MED ORDER — MUPIROCIN CALCIUM 2 % EX CREA: 1.0000 "application " | TOPICAL_CREAM | Freq: Two times a day (BID) | CUTANEOUS | Status: DC

## 2013-09-17 NOTE — ED Provider Notes (Signed)
CSN: [id number]     Arrival date & time 09/17/13  1556 History  This chart was scribed for non-physician practitioner, Jaynie Crumble, PA-C working with Merrie Roof, MD by Greggory Stallion, ED scribe. This patient was seen in room WTR7/WTR7 and the patient's care was started at 4:09 PM.   No chief complaint on file.  The history is provided by the patient. No language interpreter was used.   HPI Comments: Kimberly Hubbard is a 43 y.o. female who presents to the Emergency Department complaining of a rash to her face and bilateral arms that started several months ago. Reports it is very painful. Pt was evaluated at [facility] ED earlier this morning but left AMA before she could be treated. States she had hand surgery in March 2015 and got C-diff while she was in the hospital. Reports that since then, every time she gets a pimple, it gets infected. Family states that they have witnessed 'bugs' coming out of her skin. Pt has not seen a dermatologist or her PCP.   Past Medical History  Diagnosis Date  . Neck injury   . History of stomach ulcers   . Toe fracture    Past Surgical History  Procedure Laterality Date  . Knee arthroscopy    . Breast surgery    . Wisdom tooth extraction     No family history on file. History  Substance Use Topics  . Smoking status: Current Every Day Smoker -- 0.25 packs/day    Types: Cigarettes  . Smokeless tobacco: Never Used  . Alcohol Use: No   OB History   Grav Para Term Preterm Abortions TAB SAB Ect Mult Living                 Review of Systems  Constitutional: Negative for fever and chills.  Skin: Positive for rash.  All other systems reviewed and are negative.  Allergies  Flagyl  Home Medications   Prior to Admission medications   Medication Sig Start Date End Date Taking? Authorizing Provider  ALPRAZolam Prudy Feeler) 1 MG tablet Take 1 mg by mouth 3 (three) times daily as needed for anxiety.    Historical Provider, MD   amphetamine-dextroamphetamine (ADDERALL) 15 MG tablet Take 30 mg by mouth 2 (two) times daily.    Historical Provider, MD  gabapentin (NEURONTIN) 300 MG capsule Take 300 mg by mouth 2 (two) times daily.    Historical Provider, MD   LMP 09/15/2013  Physical Exam  Nursing note and vitals reviewed. Constitutional: She is oriented to person, place, and time. She appears well-developed and well-nourished. No distress.  HENT:  Head: Normocephalic and atraumatic.  Eyes: Conjunctivae and EOM are normal.  Neck: Neck supple. No tracheal deviation present.  Cardiovascular: Normal rate.   Pulmonary/Chest: Effort normal. No respiratory distress.  Musculoskeletal: Normal range of motion.  Neurological: She is alert and oriented to person, place, and time.  Skin: Skin is warm and dry.  Multiple sores all over entire body. Does not involve oral mucsa. Right form draining abscess, with surrounding erythema. Purulent drainage. Tender to palpation.   Psychiatric: She has a normal mood and affect. Her behavior is normal.    ED Course  Procedures (including critical care time)   COORDINATION OF CARE: 4:15 PM-Discussed treatment plan which includes culture of wound and an antibiotic with pt at bedside and pt agreed to plan. Advised pt she needs to follow up with the surgeon that did her surgery.   Labs Review Labs  Reviewed - No data to display  Imaging Review No results found.   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 Pt very tearful in ED. One lesion does appear to be infected on the right forearm. Will start on doxycycline, to cover for MRSA. Topical bactroban. chlorexidine wash.  Home with pain med for abscess. Follow up with pcp and derm. Otherwise non toxic appearing, afebrile. Pt denies drug use  Filed Vitals:   09/17/13 1620  BP: 98/78  Pulse: 106  Temp: 98.1 F (36.7  C)  TempSrc: Oral  Resp: 18  SpO2: 94%     I personally performed the services described in this documentation, which was scribed in my presence. The recorded information has been reviewed and is accurate.  Lottie Musselatyana A Bennett Ram, PA-C 09/17/13 2106

## 2013-09-17 NOTE — ED Notes (Signed)
Pt [facility], pt c/o open wounds to entire body, worse R forearm, red,warm and swollen with minimal drainage.  Pt states she has Mogellans parasites and has sample in plastic bag. Pt states she has not followed up with Dermatologist or PCP.

## 2013-09-17 NOTE — ED Notes (Signed)
Pt walks out of room stated "I am leaving, I haven't had any blood work." RN explainCharity fundraisered the reason pt has not had blood work. RN asked pt if she would like to stand here to wait for PA to explain. Pt denied. Pt walked out to Waiting room, PA Hum[employer] IncHannah Merrell notified.

## 2013-09-17 NOTE — ED Notes (Signed)
Patient is resting comfortably.  No distress

## 2013-09-17 NOTE — ED Notes (Signed)
Pt and family verbalized understanding of d/c instruction and follow up.

## 2013-09-17 NOTE — ED Notes (Signed)
Sores/ulcers all over. States, "chronic - been treated for scabies, parasites."

## 2013-09-17 NOTE — Discharge Instructions (Signed)
Take antibiotic until all gone. norco for pain. Warm compresses to the infected sites. Bactroban ointment. Follow up with primary care doctor and dermatology.   Cellulitis Cellulitis is an infection of the skin and the tissue beneath it. The infected area is usually red and tender. Cellulitis occurs most often in the arms and lower legs.  CAUSES  Cellulitis is caused by bacteria that enter the skin through cracks or cuts in the skin. The most common types of bacteria that cause cellulitis are staphylococci and streptococci. SIGNS AND SYMPTOMS   Redness and warmth.  Swelling.  Tenderness or pain.  Fever. DIAGNOSIS  Your health care provider can usually determine what is wrong based on a physical exam. Blood tests may also be done. TREATMENT  Treatment usually involves taking an antibiotic medicine. HOME CARE INSTRUCTIONS   Take your antibiotic medicine as directed by your health care provider. Finish the antibiotic even if you start to feel better.  Keep the infected arm or leg elevated to reduce swelling.  Apply a warm cloth to the affected area up to 4 times per day to relieve pain.  Take medicines only as directed by your health care provider.  Keep all follow-up visits as directed by your health care provider. SEEK MEDICAL CARE IF:   You notice red streaks coming from the infected area.  Your red area gets larger or turns dark in color.  Your bone or joint underneath the infected area becomes painful after the skin has healed.  Your infection returns in the same area or another area.  You notice a swollen bump in the infected area.  You develop new symptoms.  You have a fever. SEEK IMMEDIATE MEDICAL CARE IF:   You feel very sleepy.  You develop vomiting or diarrhea.  You have a general ill feeling (malaise) with muscle aches and pains. MAKE SURE YOU:   Understand these instructions.  Will watch your condition.  Will get help right away if you are not doing  well or get worse. Document Released: 10/26/2004 Document Revised: 06/02/2013 Document Reviewed: 04/03/2011 [facility] Patient Information 2015 [employer], [place]. This information is not intended to replace advice given to you by your health care provider. Make sure you discuss any questions you have with your health care provider.

## 2013-09-17 NOTE — ED Notes (Signed)
RN arrived in pt room. Pt in deep sleep and awaken by gentle stimulation. RN asked pt what brings you here and pt states "I don't know, I am just sleepy" RN questions pt about skin condition pt admits hx of scabies and doesn't know about parasites.

## 2013-09-17 NOTE — ED Notes (Signed)
Pt resting comfortable. Pt asleep.

## 2013-09-17 NOTE — ED Notes (Signed)
PA at bedside.

## 2013-09-17 NOTE — ED Provider Notes (Signed)
CSN: [id number]     Arrival date & time 09/17/13  0606 History   First MD Initiated Contact with Patient 09/17/13 [phone]     Chief Complaint  Patient presents with  . Skin Ulcer     (Consider location/radiation/quality/duration/timing/severity/associated sxs/prior Treatment) HPI Comments: Patient is a 43 year old female who presents to the ED for evaluation of rash. She is somnolent on exam and changes her history as she wakes up. Initially she reports that she has been itching for "2 seconds". It appears the lesions on her skin have been present for months. She reports she was seen by her PCP for this rash and given Elimite cream for scabies. She used this months ago and did not receive relief of her symptoms. She has not taken any medications recently. She denies fevers, chills, chest pain. She states she is mildly short of breath, but that this is chronic due to smoking. Patient is very somnolent and cannot tell me why she is so sleepy. She denies any drug use or ingestion prior to arrival. History is limited for this reason.   The history is provided by the patient. No language interpreter was used.    Past Medical History  Diagnosis Date  . Neck injury   . History of stomach ulcers   . Toe fracture    Past Surgical History  Procedure Laterality Date  . Knee arthroscopy    . Breast surgery    . Wisdom tooth extraction     History reviewed. No pertinent family history. History  Substance Use Topics  . Smoking status: Current Every Day Smoker -- 0.25 packs/day    Types: Cigarettes  . Smokeless tobacco: Never Used  . Alcohol Use: No   OB History   Grav Para Term Preterm Abortions TAB SAB Ect Mult Living                 Review of Systems  Constitutional: Negative for fever and chills.  Respiratory: Positive for shortness of breath.   Cardiovascular: Negative for chest pain.  Gastrointestinal: Negative for nausea, vomiting and abdominal pain.  Skin: Positive for color change  and rash.  All other systems reviewed and are negative.     Allergies  Flagyl  Home Medications   Prior to Admission medications   Medication Sig Start Date End Date Taking? Authorizing Provider  ALPRAZolam Prudy Feeler(XANAX) 1 MG tablet Take 1 mg by mouth 3 (three) times daily as needed for anxiety.   Yes Historical Provider, MD  amphetamine-dextroamphetamine (ADDERALL) 15 MG tablet Take 30 mg by mouth 2 (two) times daily.   Yes Historical Provider, MD  gabapentin (NEURONTIN) 300 MG capsule Take 300 mg by mouth 2 (two) times daily.   Yes Historical Provider, MD   BP 105/75  Pulse 84  Temp(Src) 98.2 F (36.8 C) (Oral)  Resp 12  SpO2 96%  LMP 09/15/2013 Physical Exam  Nursing note and vitals reviewed. Constitutional: She appears well-developed and well-nourished. No distress.  Patient is very somnolent, but arousable.  HENT:  Head: Normocephalic and atraumatic.  Right Ear: External ear normal.  Left Ear: External ear normal.  Nose: Nose normal.  Mouth/Throat: Oropharynx is clear and moist.  Eyes: Conjunctivae are normal.  Neck: Normal range of motion.  Cardiovascular: Normal heart sounds.   Pulmonary/Chest: Effort normal. No stridor. No respiratory distress.  Abdominal: She exhibits no distension.  Musculoskeletal: Normal range of motion.  Skin: Skin is warm and dry. She is not diaphoretic. No erythema.  Multiple skin lesions on extremities. None on back.   Psychiatric: She has a normal mood and affect. Her behavior is normal.    ED Course  Procedures (including critical care time) Labs Review Labs Reviewed - No data to display  Imaging Review No results found.   EKG Interpretation None      MDM   Final diagnoses:  Skin lesion    Patient presents to the emergency department for evaluation of skin lesions. She is very somnolent throughout her entire ED stay. She could only provide a very limited history, each time patient was awoken the story she told changed. The  patient woke up and decided to leave the emergency department before I could reevaluate her. From the limited history I could obtain from the patient she would likely benefit from antibiotics and Bactroban. Patient not receive these prescriptions as she eloped prior to my final evaluation.   Mora Bellman, PA-C 09/19/13 [phone]

## 2013-09-17 NOTE — Discharge Instructions (Signed)
°Emergency Department Resource Guide °1) Find a Doctor and Pay Out of Pocket °Although you won't have to find out who is covered by your insurance plan, it is a good idea to ask around and get recommendations. You will then need to call the office and see if the doctor you have chosen will accept you as a new patient and what types of options they offer for patients who are self-pay. Some doctors offer discounts or will set up payment plans for their patients who do not have insurance, but you will need to ask so you aren't surprised when you get to your appointment. ° °2) Contact Your Local Health Department °Not all health departments have doctors that can see patients for sick visits, but many do, so it is worth a call to see if yours does. If you don't know where your local health department is, you can check in your phone book. The CDC also has a tool to help you locate your state's health department, and many state websites also have listings of all of their local health departments. ° °3) Find a Walk-in Clinic °If your illness is not likely to be very severe or complicated, you may want to try a walk in clinic. These are popping up all over the country in pharmacies, drugstores, and shopping centers. They're usually staffed by nurse practitioners or physician assistants that have been trained to treat common illnesses and complaints. They're usually fairly quick and inexpensive. However, if you have serious medical issues or chronic medical problems, these are probably not your best option. ° °No Primary Care Doctor: °- Call [facility] at  832-8000 - they can help you locate a primary care doctor that  accepts your insurance, provides certain services, etc. °- Physician Referral Service- 1-800-533-3463 ° °Chronic Pain Problems: °Organization         Address  Phone   Notes  °[facility]  (336) 297-2271 Patients need to be referred by their primary care doctor.  ° °Medication  Assistance: °Organization         Address  Phone   Notes  °[place] [facility] [address] °[place], [place] [address] (336) 641-8030 --Must be a resident of [place] °-- Must have NO insurance coverage whatsoever (no Medicaid/ Medicare, etc.) °-- The pt. MUST have a primary care doctor that directs their care regularly and follows them in the community °  °[facility]  (866) 331-1348   °[employer]  (888) 892-1162   ° °Agencies that provide inexpensive medical care: °Organization         Address  Phone   Notes  °[facility]  (336) 832-8035   °[facility]    (336) 832-7272   °[facility] [address] °[place], [place] [address] (336) 832-4777   °[facility] [address], °[place] (336) 271-4999   °[facility]    (336) 373-0678   °[facility]    (336) 272-1050   °[facility] ° [address], [place] Phone:  (336) 832-4444, Fax:  (336) 832-4440 Hours of Operation:  9 am - 6 pm, M-F.  Also accepts Medicaid/Medicare and self-pay.  °[facility] ° [address], [place] Phone: (336) 832-3150, Fax: (336) 832-3151. Hours of Operation:  8:30 am - 5:30 pm, M-F.  Also accepts Medicaid and self-pay.  °[facility] [place] [address], [place] Phone: (336) 878-6027   °[facility] [address], [place], [place] (336)723-1848, Ext. 123 Mondays & Thursdays: 7-9 AM.  First 15 patients are seen on a first come, first serve basis. °  ° °Medicaid-accepting [place] Providers: ° °Organization         Address  Phone   Notes  °[facility] [address], [place] (336) 641-2100 Also accepts self-pay patients.  °[facility] [address], [place] ° (336) 856-9996   °[facility] [address], [place]  (336) 288-8857   °[facility] [address] [place] [address], [place] (336) 299-7000   °Veita Bland [address], [place]  ° (336) 373-1557 Only accepts [place] Access Medicaid patients after they have their name applied to their card.  ° °Self-Pay (no insurance) in [place]: ° °Organization         Address  Phone   Notes  °Sickle Cell Patients, [facility] [address], [place] (336) 832-1970   °[facility] [address], [place] (336) 832-4400   °[facility] ° [address], [place] (336) 992-4800   °[facility]/Dr. Osei-Bonsu ° [address] [place] [address], [place] or [address], [place] (336) 841-8500 Phone number for both [place] and [place] locations is the same.  °[facility] [address], [place] (336) 299-0000   °[facility] [address] [place] [address], [place] or [address] (336) 852-7530 °(336) 878-2260   °[facility] [address], [place] (336) 350-1642, phone; (336) 294-5005, fax Sees patients 1st and 3rd Saturday of every month.  Must not qualify for public or private insurance (i.e. Medicaid, Medicare, [place] Health Choice, Veterans' Benefits) • Household income should be no more than 200% of the poverty level •The clinic cannot treat you if you are pregnant or think you are pregnant • Sexually transmitted diseases are not treated at the clinic.  ° ° °Dental Care: °Organization         Address  Phone  Notes  °[place] [facility] [address], [place] (336) 641-6152 Accepts children up to age 21 who are enrolled in Medicaid or [place] Health Choice; pregnant women with a Medicaid card; and children who have applied for Medicaid or [place] Health Choice, but were declined, whose parents can pay a reduced fee at time of service.  °[place]  [facility] [place]  [address], [place] (336) 641-7733 Accepts children up to age 21 who are enrolled in Medicaid or [place] Health Choice; pregnant women with a Medicaid card; and children who have applied for Medicaid or [place] Health Choice, but were declined, whose parents can pay a reduced fee at time of service.  °[facility] ° [address], [place] (336) 641-4533 Patients are seen by appointment only. Walk-ins are not accepted. [facility] will see patients 18 years of age and older. °Monday - Tuesday (8am-5pm) °Most Wednesdays (8:30-5pm) °$30 per visit, cash only  °[facility] ° [address], [place] (336) 641-4533 Patients are seen by appointment only. Walk-ins are not accepted. [facility] will see patients 18 years of age and older. °One   Wednesday Evening (Monthly: Volunteer Based).  $30 per visit, cash only  °[facility]  (919) 537-3737 for adults; Children under age 4, call Graduate Pediatric Dentistry at (919) [phone]. Children aged 4-14, please call (919) [phone] to request a pediatric application. ° Dental services are provided in all areas of dental care including fillings, crowns and bridges, complete and partial dentures, implants, gum treatment, root canals, and extractions. Preventive care is also provided. Treatment is provided to both adults and children. °Patients are selected via a lottery and there is often a waiting list. °  °[facility] [address], °[place] ° (336) 763-8833 [email or web address] °  °[facility] [address], [place], [place] (336)723-1848, Ext. 123 Second and Fourth Thursday of each month, opens at 6:30 AM; Clinic ends at 9 AM.  Patients are seen on a first-come first-served basis, and a limited number are seen during each clinic.  ° °[facility] ° [address], [place], [place] (336) 723-7904    Eligibility Requirements °You must have lived in [place], [place], or [place] counties for at least the last three months. °  You cannot be eligible for state or federal sponsored healthcare insurance, including Veterans Administration, Medicaid, or Medicare. °  You generally cannot be eligible for healthcare insurance through your employer.  °  How to apply: °Eligibility screenings are held every Tuesday and Wednesday afternoon from 1:00 pm until 4:00 pm. You do not need an appointment for the interview!  °[facility] [address], [place], [place] 336-631-2330   °[facility]  336-342-8273   °[facility]  336-703-3100   °[facility]  336-570-6415   ° °Behavioral Health Resources in the Community: °Intensive Outpatient Programs °Organization         Address  Phone  Notes  °[facility] [address], [place], [place] 336-878-6098   °[facility] [address], [place], [place] 336-832-9800   °[facility] [address], [place], [place] ° 336-882-2125   °[facility] [address],  °[place], [place] 1-800-853-5163 or 336-641-4981   °Substance Abuse Resources °Organization         Address  Phone  Notes  °[facility]  336-882-2125   °[facility]  336-784-9470   °[facility]  336-285-9073   °[facility]  336-845-3988   °Residential & Outpatient Substance Abuse Program  1-800-659-3381   °Psychological Services °Organization         Address  Phone  Notes  °[facility]  336- 832-9600   °[facility]  336- 378-7881   °[facility] [address], [place] 1-800-853-5163 or 336-641-4981   ° °Mobile Crisis Teams °Organization         Address  Phone  Notes  °[facility], Mobile Crisis Care Unit  1-877-626-1772   °Assertive °[facility] ° [address]  [place], [place] 336-834-9664   °Sharon DeEsch [address] °[place] 336-554-5454   ° °Self-Help/Support Groups °Organization         Address  Phone             Notes  °[facility] - variety of support groups  336- 373-1402 Call for more information  °Narcotics Anonymous (NA), [facility] [address], °[place]   2 meetings at this location  ° °  Residential Treatment Programs °Organization         Address  Phone  Notes  °[facility] [address],    °[place]  1-866-801-8205   °[facility] ° [address], [place], [place] 704-293-8524   °[facility] [address], [place] 336-845-3988 Admissions: 8am-3pm M-F  °[facility] [address]    °[place], [place] 336-841-1104   °[facility] [address], [place], [place] 336-379-7146   °[facility] [address]  °[place], [place] 336-285-9073   °[facility] Intensive Outpatient [address], [place], [place] 336-852-3033   °[facility] [address]  °[place], [place] 1-877-615-2722 or 336-784-9470   °[facility] [address] [place], [place] 336-227-7417 Accepts Medicaid  °[facility] [address]  °[place] 1-800-659-3381 Substance Abuse/Addiction Treatment  ° °[place] Behavioral Health Resources °Organization         Address  Phone  Notes  °[facility]  (888) 581-9988   °Julie Brannon, PhD [address] [place], [place]   (336) 349-5553 or (336) 951-0000   °[facility]   [address] °[place], [place] (336) 349-4454   °[facility] [address], [place], [place] (336) 342-8316 Insurance/Medicaid/sponsorship through [facility]  °[facility] [address]                                    [place], [place] (336) 342-8316 Therapy/tele-psych/case    °[facility] [address]  ° [place], [place] (336) 349-2233    °Dr. Arfeen  (336) 349-4544   °[facility] [place]  [employer] [place] [facility] 1) [address], [place] °2) [address], [place] °3)  [address], [place] (336) 349-3220 °(336) 342-7768 ° °(336) 342-8140   °[place] [facility] (336) 342-1394 or (336) 342-3537 (After Hours)    ° ° °

## 2013-09-18 NOTE — ED Provider Notes (Signed)
I supervised this telephone call  Kimberly Munchobert [name], MD 09/18/13 1600

## 2013-09-18 NOTE — ED Provider Notes (Signed)
Medical screening examination/treatment/procedure(s) were performed by non-physician practitioner and as supervising physician I was immediately available for consultation/collaboration.   EKG Interpretation None        Kimberly ChurnJohn David Anni Holland III, MD 09/18/13 [phone]

## 2013-09-18 NOTE — ED Provider Notes (Signed)
I received a call from the patient's pharmacy stating that they did not have 2% Chlorhexidine solution and would it be acceptable to use 4% chlorhexidine.  After speaking with Dr. Jeraldine LootsLockwood I approved this substitution and told the pharmacist that she could dilute the solution.    Eben Burowourtney A Forcucci, PA-C 09/18/13 1302

## 2013-09-18 NOTE — ED Notes (Signed)
Patient discharged at 16:45 on 8/19.

## 2013-09-18 NOTE — ED Notes (Signed)
Patient was discharged on 8/19 at 1643.  Pain assessment was completed at time of discharge by RN.

## 2013-09-20 ENCOUNTER — Telehealth (HOSPITAL_BASED_OUTPATIENT_CLINIC_OR_DEPARTMENT_OTHER): Payer: Self-pay | Admitting: Emergency Medicine

## 2013-09-20 LAB — WOUND CULTURE
Gram Stain: NONE SEEN
Special Requests: NORMAL

## 2013-09-20 NOTE — Telephone Encounter (Signed)
+   wound culture, + MRSA Treated with Doxycycline, sensitive to same per protocol MD  Attempt to contact patient, left voicemail to call office #

## 2013-09-20 NOTE — ED Provider Notes (Signed)
Medical screening examination/treatment/procedure(s) were performed by non-physician practitioner and as supervising physician I was immediately available for consultation/collaboration.   EKG Interpretation None     Patient with rash. Acute on chronic. Has been seen by dermatology for it. Left ER before complete evaluation  Juliet Rudeathan R. Rubin PayorPickering, MD 09/20/13 [phone]

## 2013-09-22 ENCOUNTER — Telehealth (HOSPITAL_BASED_OUTPATIENT_CLINIC_OR_DEPARTMENT_OTHER): Payer: Self-pay

## 2013-09-22 ENCOUNTER — Telehealth (HOSPITAL_BASED_OUTPATIENT_CLINIC_OR_DEPARTMENT_OTHER): Payer: Self-pay | Admitting: Emergency Medicine

## 2013-09-22 NOTE — Telephone Encounter (Signed)
Post ED Visit - Positive Culture Follow-up  Culture report reviewed by antimicrobial stewardship pharmacist:  Wes Dulaney, Pharm.D., BCPS  Celedonio Miyamoto, Pharm.D., BCPS  Georgina Pillion, Pharm.D., BCPS  [place], [address].D., BCPS, AAHIVP  Estella Husk, Pharm.D., BCPS, AAHIVP  Red Christians, Pharm.D.  Tennis Must, [place].D.  Positive wound culture Treated with Doxycycline , organism sensitive to the same and no further patient follow-up is required at this time.  Berle Mull 09/22/2013, 3:34 PM

## 2013-09-22 NOTE — Telephone Encounter (Signed)
Post ED Visit - Positive Culture Follow-up  Culture report reviewed by antimicrobial stewardship pharmacist:  Wes Dulaney, Pharm.D., BCPS  Celedonio Miyamoto, Pharm.D., BCPS  Georgina Pillion, [address].D., BCPS  [place], [place].D., BCPS, AAHIVP  Estella Husk, Pharm.D., BCPS, AAHIVP  Red Christians, Pharm.D.  Tennis Must, Pharm.D.  Positive Wound culture, MRSA Treated with Doxycycline, organism sensitive to the same and no further patient follow-up is required at this time.  Arvid Right 09/22/2013, 5:12 AM

## 2013-09-23 ENCOUNTER — Emergency Department (HOSPITAL_COMMUNITY): Payer: Medicaid Other

## 2013-09-23 ENCOUNTER — Emergency Department (HOSPITAL_COMMUNITY)
Admission: EM | Admit: 2013-09-23 | Discharge: 2013-09-23 | Disposition: A | Discharge status: Home / Self Care | Payer: Medicaid Other | Attending: Emergency Medicine | Admitting: Emergency Medicine

## 2013-09-23 ENCOUNTER — Encounter (HOSPITAL_COMMUNITY): Payer: Self-pay | Admitting: Emergency Medicine

## 2013-09-23 DIAGNOSIS — Y9389 Activity, other specified: Secondary | ICD-10-CM | POA: Insufficient documentation

## 2013-09-23 DIAGNOSIS — S8990XA Unspecified injury of unspecified lower leg, initial encounter: Secondary | ICD-10-CM | POA: Insufficient documentation

## 2013-09-23 DIAGNOSIS — S99929A Unspecified injury of unspecified foot, initial encounter: Secondary | ICD-10-CM

## 2013-09-23 DIAGNOSIS — Z8719 Personal history of other diseases of the digestive system: Secondary | ICD-10-CM | POA: Insufficient documentation

## 2013-09-23 DIAGNOSIS — T148XXA Other injury of unspecified body region, initial encounter: Secondary | ICD-10-CM

## 2013-09-23 DIAGNOSIS — S8000XA Contusion of unspecified knee, initial encounter: Secondary | ICD-10-CM | POA: Insufficient documentation

## 2013-09-23 DIAGNOSIS — F17200 Nicotine dependence, unspecified, uncomplicated: Secondary | ICD-10-CM | POA: Diagnosis not present

## 2013-09-23 DIAGNOSIS — Z79899 Other long term (current) drug therapy: Secondary | ICD-10-CM | POA: Diagnosis not present

## 2013-09-23 DIAGNOSIS — R296 Repeated falls: Secondary | ICD-10-CM | POA: Diagnosis not present

## 2013-09-23 DIAGNOSIS — IMO0002 Reserved for concepts with insufficient information to code with codable children: Secondary | ICD-10-CM | POA: Diagnosis not present

## 2013-09-23 DIAGNOSIS — S8002XA Contusion of left knee, initial encounter: Secondary | ICD-10-CM

## 2013-09-23 DIAGNOSIS — Z792 Long term (current) use of antibiotics: Secondary | ICD-10-CM | POA: Insufficient documentation

## 2013-09-23 DIAGNOSIS — Y9289 Other specified places as the place of occurrence of the external cause: Secondary | ICD-10-CM | POA: Insufficient documentation

## 2013-09-23 DIAGNOSIS — S99919A Unspecified injury of unspecified ankle, initial encounter: Secondary | ICD-10-CM

## 2013-09-23 MED ORDER — IBUPROFEN 800 MG PO TABS: 800.0000 mg | ORAL_TABLET | Freq: Three times a day (TID) | ORAL | Status: DC | PRN

## 2013-09-23 MED ORDER — HYDROCODONE-ACETAMINOPHEN 5-325 MG PO TABS: 1.0000 | ORAL_TABLET | Freq: Four times a day (QID) | ORAL | Status: DC | PRN

## 2013-09-23 NOTE — ED Notes (Signed)
Pt reports walking uphill on pavement when she fell forward onto knees. Bilateral knee pain, much worse in L knee. Abrasions noted to both knees. Pt reports she saw doctor today who told her she may have fractured her patella and advised her to come to ED for imaging.

## 2013-09-23 NOTE — ED Notes (Signed)
Bed: WTR5 Expected date:  Expected time:  Means of arrival:  Comments: ems- dental pain

## 2013-09-23 NOTE — ED Provider Notes (Signed)
CSN: [id number]     Arrival date & time 09/23/13  1707 History   First MD Initiated Contact with Patient 09/23/13 1712     This chart was scribed for non-physician practitioner, Ebbie Ridge PA-C working with Mirian Mo, MD by Arlan Organ, ED Scribe. This patient was seen in room WTR5/WTR5 and the patient's care was started at 5:26 PM.   No chief complaint on file.  HPI  HPI Comments: Kimberly Hubbard is a 43 y.o. female who presents to the Emergency Department complaining of constant, moderate bilateral knee pain; L greater than R x 2 days that is unchanged.  Pt states pain radiates into her calfs.She states she fell on her knees while taking her daughter to the pool. Pt has also sustained abrasions to knees bilaterally. She has not tried any OTC medications to help manage symptoms. She reports a PSHx of R knee arthroscopy. Pt was evaluated by her PCP and advised to come to ED for X-Rays and for further evaluation. Pt with known allergy to Flagyl. No other concerns this visit.  Past Medical History  Diagnosis Date  . Neck injury   . History of stomach ulcers   . Toe fracture    Past Surgical History  Procedure Laterality Date  . Knee arthroscopy    . Breast surgery    . Wisdom tooth extraction     No family history on file. History  Substance Use Topics  . Smoking status: Current Every Day Smoker -- 0.25 packs/day    Types: Cigarettes  . Smokeless tobacco: Never Used  . Alcohol Use: No   OB History   Grav Para Term Preterm Abortions TAB SAB Ect Mult Living                 Review of Systems  Constitutional: Negative for fever and chills.  Musculoskeletal: Positive for arthralgias.  Skin: Positive for wound.      Allergies  Flagyl  Home Medications   Prior to Admission medications   Medication Sig Start Date End Date Taking? Authorizing Provider  ALPRAZolam Prudy Feeler) 1 MG tablet Take 1 mg by mouth 3 (three) times daily as needed for anxiety.    Historical  Provider, MD  amphetamine-dextroamphetamine (ADDERALL) 15 MG tablet Take 30 mg by mouth 2 (two) times daily.    Historical Provider, MD  Chlorhexidine Gluconate 2 % SOLN Use daily 09/17/13   Tatyana A Kirichenko, PA-C  doxycycline (VIBRAMYCIN) 100 MG capsule Take 1 capsule (100 mg total) by mouth 2 (two) times daily. 09/17/13   Tatyana A Kirichenko, PA-C  gabapentin (NEURONTIN) 300 MG capsule Take 300 mg by mouth 2 (two) times daily.    Historical Provider, MD  HYDROcodone-acetaminophen (NORCO/VICODIN) 5-325 MG per tablet Take 1 tablet by mouth every 4 (four) hours as needed for moderate pain or severe pain. 09/17/13   Tatyana A Kirichenko, PA-C  mupirocin cream (BACTROBAN) 2 % Apply 1 application topically 2 (two) times daily. 09/17/13   Tatyana A Kirichenko, PA-C   Triage Vitals: BP 111/77  Pulse 84  Temp(Src) 98.5 F (36.9 C) (Oral)  Resp 16  SpO2 98%  LMP 09/15/2013   Physical Exam  Nursing note and vitals reviewed. Constitutional: She is oriented to person, place, and time. She appears well-developed and well-nourished.  HENT:  Head: Normocephalic.  Eyes: EOM are normal.  Neck: Normal range of motion.  Pulmonary/Chest: Effort normal.  Abdominal: She exhibits no distension.  Musculoskeletal:       Left knee:  She exhibits decreased range of motion and swelling. She exhibits no ecchymosis and no deformity.       Legs: Neurological: She is alert and oriented to person, place, and time.  Psychiatric: She has a normal mood and affect.    ED Course  Procedures (including critical care time)  DIAGNOSTIC STUDIES: Oxygen Saturation is 98% on RA, Normal by my interpretation.    COORDINATION OF CARE: 5:28 PM- Will order DG knee complete 4 view R and DG knee complete 4 views L. Discussed treatment plan with pt at bedside and pt agreed to plan.     Patient be referred back to her orthopedist placed in a knee immobilizer with crutches  I personally performed the services described in  this documentation, which was scribed in my presence. The recorded information has been reviewed and is accurate.    Carlyle Dolly, PA-C 09/23/13 [phone]

## 2013-09-23 NOTE — ED Notes (Signed)
Pt in bathroom at this time.

## 2013-09-23 NOTE — Discharge Instructions (Signed)
Ice and elevate the knee. Return here as needed. Follow up with your doctor and the orthopedist.

## 2013-09-27 NOTE — ED Provider Notes (Signed)
Medical screening examination/treatment/procedure(s) were performed by non-physician practitioner and as supervising physician I was immediately available for consultation/collaboration.   EKG Interpretation None        Gaige Sebo, MD 09/27/13 0843 

## 2013-10-16 ENCOUNTER — Emergency Department (HOSPITAL_COMMUNITY)
Admission: EM | Admit: 2013-10-16 | Discharge: 2013-10-16 | Discharge status: Against Medical Advice | Payer: Medicaid Other | Attending: Emergency Medicine | Admitting: Emergency Medicine

## 2013-10-16 ENCOUNTER — Encounter (HOSPITAL_COMMUNITY): Payer: Self-pay | Admitting: Emergency Medicine

## 2013-10-16 ENCOUNTER — Emergency Department (HOSPITAL_COMMUNITY): Payer: Medicaid Other

## 2013-10-16 DIAGNOSIS — S8990XA Unspecified injury of unspecified lower leg, initial encounter: Secondary | ICD-10-CM | POA: Insufficient documentation

## 2013-10-16 DIAGNOSIS — F17200 Nicotine dependence, unspecified, uncomplicated: Secondary | ICD-10-CM | POA: Diagnosis not present

## 2013-10-16 DIAGNOSIS — Y939 Activity, unspecified: Secondary | ICD-10-CM | POA: Insufficient documentation

## 2013-10-16 DIAGNOSIS — S99919A Unspecified injury of unspecified ankle, initial encounter: Secondary | ICD-10-CM | POA: Diagnosis present

## 2013-10-16 DIAGNOSIS — IMO0002 Reserved for concepts with insufficient information to code with codable children: Secondary | ICD-10-CM | POA: Insufficient documentation

## 2013-10-16 DIAGNOSIS — Y929 Unspecified place or not applicable: Secondary | ICD-10-CM | POA: Insufficient documentation

## 2013-10-16 DIAGNOSIS — S99929A Unspecified injury of unspecified foot, initial encounter: Principal | ICD-10-CM

## 2013-10-16 MED ORDER — HYDROCODONE-ACETAMINOPHEN 5-325 MG PO TABS
2.0000 | ORAL_TABLET | Freq: Once | ORAL | Status: AC
Start: 2013-10-16 — End: 2013-10-16
  Administered 2013-10-16: 2 via ORAL
  Filled 2013-10-16: qty 2

## 2013-10-16 NOTE — ED Notes (Addendum)
Patient refused lab work. RN made aware. 

## 2013-10-16 NOTE — ED Notes (Signed)
R/foot markedly red and swollen. Pt reports increased pain and swelling over lat two days. Stated that it feels tingling and "on fire"  Pt treated pain with oxycodone 20 mg x 5 over last 24 hours. Pt "stubbed" 5th  Toe of r/foot on leg of table 2  Days ago

## 2013-10-16 NOTE — ED Notes (Signed)
Pt refused to have labs drawn. Pt encouraged to have it drawn, declined again

## 2013-10-16 NOTE — ED Notes (Signed)
Upon assessment of room, pt noted to be gone and her stuff gone as well. Pt was made aware that she cannot drive but pt left while this RN was in another room.

## 2013-10-16 NOTE — ED Notes (Signed)
Pt reports that she does not want to stay any longer and she would like to leave. Pt has refused further treatment up until this point in terms of blood work and PA said she is free to leave. Pt advised that she cannot drive and she needs to wait for her ride to get here because she received narcotic medication in the ER. Pt is aware and said that her ride is on the way and that she will press the call bell and let this RN know when her ride arrives.

## 2013-12-04 ENCOUNTER — Other Ambulatory Visit: Payer: Self-pay | Admitting: Family Medicine

## 2013-12-04 DIAGNOSIS — Z1231 Encounter for screening mammogram for malignant neoplasm of breast: Secondary | ICD-10-CM

## 2013-12-23 ENCOUNTER — Ambulatory Visit (INDEPENDENT_AMBULATORY_CARE_PROVIDER_SITE_OTHER): Payer: Medicaid Other | Admitting: Physician Assistant

## 2013-12-23 ENCOUNTER — Encounter: Payer: Self-pay | Admitting: Physician Assistant

## 2013-12-23 ENCOUNTER — Other Ambulatory Visit: Payer: Medicaid Other

## 2013-12-23 VITALS — BP 100/64 | HR 88 | Temp 98.0°F | Ht 65.25 in | Wt 157.0 lb

## 2013-12-23 DIAGNOSIS — R109 Unspecified abdominal pain: Secondary | ICD-10-CM

## 2013-12-23 DIAGNOSIS — A0472 Enterocolitis due to Clostridium difficile, not specified as recurrent: Secondary | ICD-10-CM | POA: Insufficient documentation

## 2013-12-23 DIAGNOSIS — F909 Attention-deficit hyperactivity disorder, unspecified type: Secondary | ICD-10-CM | POA: Insufficient documentation

## 2013-12-23 DIAGNOSIS — K5900 Constipation, unspecified: Secondary | ICD-10-CM

## 2013-12-23 DIAGNOSIS — K921 Melena: Secondary | ICD-10-CM

## 2013-12-23 DIAGNOSIS — F411 Generalized anxiety disorder: Secondary | ICD-10-CM

## 2013-12-23 DIAGNOSIS — A047 Enterocolitis due to Clostridium difficile: Secondary | ICD-10-CM

## 2013-12-23 DIAGNOSIS — F902 Attention-deficit hyperactivity disorder, combined type: Secondary | ICD-10-CM

## 2013-12-23 DIAGNOSIS — R195 Other fecal abnormalities: Secondary | ICD-10-CM

## 2013-12-23 MED ORDER — OMEPRAZOLE 40 MG PO CPDR: 40.0000 mg | DELAYED_RELEASE_CAPSULE | Freq: Every day | ORAL | Status: AC

## 2013-12-23 NOTE — Patient Instructions (Addendum)
Please go to the basement level to have your labs drawn and stool tests. Take Miralax , 17 grams daily in 8 oz of water. Take Prilosec 40 mg every morning. Stop Aleve- use Tylenol.   You have been scheduled for a CT scan of the abdomen and pelvis at [facility] ([address]---this is in the same building as [facility].) You are scheduled on 12-24-2013 at 11:30 am . You should arrive  At 11:15 am. prior to your appointment time for registration. Please follow the written instructions below on the day of your exam:  WARNING: IF YOU ARE ALLERGIC TO IODINE/X-RAY DYE, PLEASE NOTIFY RADIOLOGY IMMEDIATELY AT [phone]! YOU WILL BE GIVEN A 13 HOUR PREMEDICATION PREP.  1) Do not eat or drink anything after 7:30 am  (4 hours prior to your test) 2) You have been given 2 bottles of oral contrast to drink. The solution may taste  better if refrigerated, but do NOT add ice or any other liquid to this solution. Shake  well before drinking.    Drink 1 bottle of contrast @ 9:30 am  (2 hours prior to your exam)  Drink 1 bottle of contrast @ 10:30 am  (1 hour prior to your exam)  You may take any medications as prescribed with a small amount of water except for the following: Metformin, Glucophage, Glucovance, Avandamet, Riomet, Fortamet, Actoplus Met, Janumet, Glumetza or Metaglip. The above medications must be held the day of the exam AND 48 hours after the exam.  The purpose of you drinking the oral contrast is to aid in the visualization of your intestinal tract. The contrast solution may cause some diarrhea. Before your exam is started, you will be given a small amount of fluid to drink. Depending on your individual set of symptoms, you may also receive an intravenous injection of x-ray contrast/dye. Plan on being at [facility] for 30 minutes or long, depending on the type of exam you are having performed.  If you have any questions regarding your exam or  if you need to reschedule, you may call the CT department at [phone] between the hours of 8:00 am and 5:00 pm, Monday-Friday.  ________________________________________________________________________

## 2013-12-23 NOTE — Progress Notes (Signed)
Patient ID: Kimberly Hubbard, female   DOB: [birth year], 43 y.o.   MRN: [id number]   Subjective:    Patient ID: Kimberly Hubbard, female    DOB: [birth year], 43 y.o.   MRN: [id number]  HPI Kimberly Hubbard is a 43 year old white female new to GI today, self-referred for evaluation of abdominal pain and concerns about a parasitic infection. She has been seen at [facility] previously and has history of ADHD, anxiety and had stool testing done in February 2015 there which I do not have the results of. She reports that she was hospitalized at [facility] in the spring of 2015 after she had surgery on her hand and developed a C. difficile infection. By her report she was treated with vancomycin. She did not see a gastroenterologist by her report. He says ever since that time she's been having trouble with sores breaking out on her skin. She reports ongoing problems with abdominal pain and cramping which a been worse over the past week reticular early on her right side and around into her back. Her appetite has been decreased. He has not had any documented weight loss. No fever or chills. When asked about her bowel movements she says she's actually been constipated and having hard bowel movements, but when she does go she will see mucus and what she thinks is blood and "empty egg sac" mixed in with her stool. She shows me a picture of something she passed a few weeks ago which she saved  in a cup. She describes these as looking like tomato skin, and says they were round and about the size of a quarter. She says she also woke up one night recently with pain on her face and something about an inch long came out of one of her sores. She is asking for something for pain as she has been taking 9 or 10 Aleve per day for abdominal pain and back pain apparently. He has not had any vomiting. By her report she has not had any significant psychiatric illness in the past. She also reports that she had seen a dermatologist  recently in [place] for her skin lesions and was told that these were secondary to "stress".  Review of Systems Pertinent positive and negative review of systems were noted in the above HPI section.  All other review of systems was otherwise negative.  Outpatient Encounter Prescriptions as of 12/23/2013  Medication Sig  . gabapentin (NEURONTIN) 300 MG capsule Take 300 mg by mouth 2 (two) times daily.  . [DISCONTINUED] amphetamine-dextroamphetamine (ADDERALL) 15 MG tablet Take 30 mg by mouth 2 (two) times daily.  . [DISCONTINUED] ibuprofen (ADVIL,MOTRIN) 800 MG tablet Take 1 tablet (800 mg total) by mouth every 8 (eight) hours as needed.  . [DISCONTINUED] Oxycodone HCl 20 MG TABS Take 20 mg by mouth every 6 (six) hours.  Marland Kitchen omeprazole (PRILOSEC) 40 MG capsule Take 1 capsule (40 mg total) by mouth daily.   Allergies  Allergen Reactions  . Flagyl [Metronidazole] Hives and Swelling   Patient Active Problem List   Diagnosis Date Noted  . ADHD (attention deficit hyperactivity disorder) 12/23/2013  . Generalized anxiety disorder 12/23/2013  . Colitis due to Clostridium difficile 12/23/2013   History   Social History  . Marital Status: Single    Spouse Name: N/A    Number of Children: 1  . Years of Education: N/A   Occupational History  . Dog Groomer    Social History Main Topics  . Smoking  status: Former Smoker -- 0.75 packs/day for 33 years    Types: Cigarettes    Quit date: 12/21/2013  . Smokeless tobacco: Never Used  . Alcohol Use: No  . Drug Use: No  . Sexual Activity: No   Other Topics Concern  . Not on file   Social History Narrative    Kimberly Hubbard's family history includes Diabetes in her father; Heart disease in her father. There is no history of Colon cancer, Colon polyps, Kidney disease, or Esophageal cancer.      Objective:    Filed Vitals:   12/23/13 0849  BP: 100/64  Pulse: 88  Temp: 98 F (36.7 C)    Physical Exam  well-developed anxious  appearing white female in no acute distress, somewhat disheveled appearing height 5 foot 5 weight 157. HEENT: nontraumatic normocephalic EOMI PERRLA sclera anicteric, Supple :no JVD, Cardiovascular :regular rate and rhythm with S1-S2 no murmur rub or gallop, Pulmonary: clear bilaterally, Abdomen :soft she is tender in the right lower quadrant and right mid quadrant there is no guarding or rebound no palpable mass or hepatosplenomegaly she does have a couple of skin lesions which appeared to be scabbed over, bowel sounds are present, Rectal :exam not done, Extremities: patient has numerous rounded sores on her lower extremities some  scarred and some  scabbed. Psych: mood and affect somewhat flat but appropriate       Assessment & Plan:   #80  43 year old female with reported history of C. difficile colitis Spring 2015 now with complaints of several month history of skin source, persistent abdominal pain primarily right-sided, constipation, and abnormal stool contents which patient is convinced are  Parasites. I am concerned that she may have a delusional parasitosis, but we will evaluate for right-sided abdominal pain and rule out any objective evidence of colitis or parasitic infection #2 chronic anxiety #3 ADHD  Plan; CBC with differential, see met, CRP and sedimentation rate GI pathogen panel and stool for O&P Patient advised to stop taking Aleve as this is likely contributing to her abdominal pain and use Tylenol as needed Start Prilosec 40 mg by mouth every morning Patient signed a release today and will attempt to get her records from the dermatologist and also from evaluation at [facility] pending findings of above   Kimberly Ferguson PA-C 12/23/2013

## 2013-12-23 NOTE — Progress Notes (Signed)
Agree with initial assessment and plan. Should assess for organic causes though I also suspicious that the principle issues maybe psychiatric. Amy, please follow-up on the excellent and extensive plans that you have outlined. Thank you

## 2013-12-24 ENCOUNTER — Ambulatory Visit (INDEPENDENT_AMBULATORY_CARE_PROVIDER_SITE_OTHER)
Admission: RE | Admit: 2013-12-24 | Discharge: 2013-12-24 | Disposition: A | Discharge status: Home / Self Care | Payer: Medicaid Other | Source: Ambulatory Visit | Attending: Physician Assistant | Admitting: Physician Assistant

## 2013-12-24 ENCOUNTER — Other Ambulatory Visit: Payer: Medicaid Other

## 2013-12-24 DIAGNOSIS — R109 Unspecified abdominal pain: Secondary | ICD-10-CM

## 2013-12-24 DIAGNOSIS — K5900 Constipation, unspecified: Secondary | ICD-10-CM

## 2013-12-24 DIAGNOSIS — K921 Melena: Secondary | ICD-10-CM

## 2013-12-24 DIAGNOSIS — R195 Other fecal abnormalities: Secondary | ICD-10-CM

## 2013-12-24 MED ORDER — IOHEXOL 300 MG/ML  SOLN: 100.0000 mL | Freq: Once | INTRAMUSCULAR | Status: AC | PRN

## 2013-12-26 LAB — GASTROINTESTINAL PATHOGEN PANEL PCR
C. difficile Tox A/B, PCR: NEGATIVE
CRYPTOSPORIDIUM, PCR: NEGATIVE
Campylobacter, PCR: NEGATIVE
E COLI (STEC) STX1/STX2, PCR: NEGATIVE
E COLI 0157, PCR: NEGATIVE
E coli (ETEC) LT/ST PCR: NEGATIVE
Giardia lamblia, PCR: NEGATIVE
NOROVIRUS, PCR: NEGATIVE
Rotavirus A, PCR: NEGATIVE
SALMONELLA, PCR: NEGATIVE
SHIGELLA, PCR: NEGATIVE

## 2013-12-26 LAB — OVA AND PARASITE EXAMINATION: OP: NONE SEEN

## 2013-12-29 ENCOUNTER — Telehealth: Payer: Self-pay | Admitting: Physician Assistant

## 2013-12-29 NOTE — Telephone Encounter (Signed)
Patient advised the results are in but have not been reviewed by her provider.

## 2013-12-30 NOTE — Telephone Encounter (Signed)
Reviewed labs and stool studies , and CT scan all of which are normal. I do not think she has worms or parasites . Discussed with Dr. Marina GoodellPerry. Sometimes people become obsessed with concerns about parasites, and this can be associated with underlying psychiatric issues. Would like her to seek Psychiatric help. She can also seek another GI opinion , elsewhere  if she desires (would not refer)

## 2013-12-30 NOTE — Telephone Encounter (Signed)
Jp, are you ok with giving her a course of Albenza empirically which would treat multiple different helminths- if that doesn't work , then  Will have to broach the delusional possibility.

## 2013-12-30 NOTE — Telephone Encounter (Signed)
Pt is calling results from CAT scan that was done on 11/25.

## 2013-12-30 NOTE — Telephone Encounter (Signed)
Spoke with the patient. Advised of normal results from imaging and from her lab studies. She is interested in a 2nd opinion. Advised on how to get her medical records released.

## 2013-12-30 NOTE — Telephone Encounter (Signed)
As we discussed, I would not treat with negative testing. They could seek another opinion elsewhere, if not GI then ID. Please let them know

## 2013-12-30 NOTE — Telephone Encounter (Signed)
This patient was insistent on getting her results. I told the patient the labs did not show anything and that once they were reviewed, I would call her with recommendations. She disagrees with the results.

## 2013-12-31 ENCOUNTER — Ambulatory Visit: Payer: Medicaid Other

## 2013-12-31 ENCOUNTER — Other Ambulatory Visit (INDEPENDENT_AMBULATORY_CARE_PROVIDER_SITE_OTHER): Payer: Medicaid Other

## 2013-12-31 DIAGNOSIS — K5909 Other constipation: Secondary | ICD-10-CM

## 2013-12-31 DIAGNOSIS — K5900 Constipation, unspecified: Secondary | ICD-10-CM

## 2013-12-31 DIAGNOSIS — R109 Unspecified abdominal pain: Secondary | ICD-10-CM

## 2013-12-31 LAB — COMPREHENSIVE METABOLIC PANEL
ALK PHOS: 75 U/L (ref 39–117)
ALT: 19 U/L (ref 0–35)
AST: 26 U/L (ref 0–37)
Albumin: 4.6 g/dL (ref 3.5–5.2)
BUN: 22 mg/dL (ref 6–23)
CALCIUM: 8.9 mg/dL (ref 8.4–10.5)
CHLORIDE: 101 meq/L (ref 96–112)
CO2: 26 mEq/L (ref 19–32)
CREATININE: 0.9 mg/dL (ref 0.4–1.2)
GFR: 76.48 mL/min (ref >60.00)
Glucose, Bld: 102 mg/dL — ABNORMAL HIGH (ref 70–99)
Potassium: 3.4 mEq/L — ABNORMAL LOW (ref 3.5–5.1)
Sodium: 133 mEq/L — ABNORMAL LOW (ref 135–145)
Total Bilirubin: 0.7 mg/dL (ref 0.2–1.2)
Total Protein: 7.7 g/dL (ref 6.0–8.3)

## 2013-12-31 LAB — CBC WITH DIFFERENTIAL/PLATELET
BASOS ABS: 0 10*3/uL (ref 0.0–0.1)
Basophils Relative: 0.4 % (ref 0.0–3.0)
EOS ABS: 0.5 10*3/uL (ref 0.0–0.7)
Eosinophils Relative: 7.1 % — ABNORMAL HIGH (ref 0.0–5.0)
HEMATOCRIT: 37.5 % (ref 36.0–46.0)
HEMOGLOBIN: 12.6 g/dL (ref 12.0–15.0)
LYMPHS ABS: 2.3 10*3/uL (ref 0.7–4.0)
Lymphocytes Relative: 30.3 % (ref 12.0–46.0)
MCHC: 33.7 g/dL (ref 30.0–36.0)
MCV: 96.9 fl (ref 78.0–100.0)
Monocytes Absolute: 0.5 10*3/uL (ref 0.1–1.0)
Monocytes Relative: 6.8 % (ref 3.0–12.0)
NEUTROS ABS: 4.3 10*3/uL (ref 1.4–7.7)
Neutrophils Relative %: 55.4 % (ref 43.0–77.0)
Platelets: 297 10*3/uL (ref 150.0–400.0)
RBC: 3.87 Mil/uL (ref 3.87–5.11)
RDW: 13 % (ref 11.5–15.5)
WBC: 7.8 10*3/uL (ref 4.0–10.5)

## 2013-12-31 LAB — SEDIMENTATION RATE: Sed Rate: 16 mm/hr (ref 0–22)

## 2013-12-31 LAB — C-REACTIVE PROTEIN: CRP: <0.5 mg/dL (ref 0.5–20.0)

## 2014-01-01 ENCOUNTER — Other Ambulatory Visit: Payer: Self-pay

## 2014-01-01 ENCOUNTER — Telehealth: Payer: Self-pay | Admitting: Physician Assistant

## 2014-01-01 MED ORDER — POTASSIUM CHLORIDE CRYS ER 20 MEQ PO TBCR: 20.0000 meq | EXTENDED_RELEASE_TABLET | Freq: Two times a day (BID) | ORAL | Status: AC

## 2014-01-01 NOTE — Telephone Encounter (Signed)
Patient given lab results. 

## 2015-08-16 IMAGING — CR DG FOOT COMPLETE 3+V*R*
3 series · 3 of 3 positions shown · non-contrast
Comparison: None.

CLINICAL DATA: Foot injury 3 days ago with swelling of right foot.

EXAM:
RIGHT FOOT COMPLETE - 3+ VIEW

[x foot ap right]
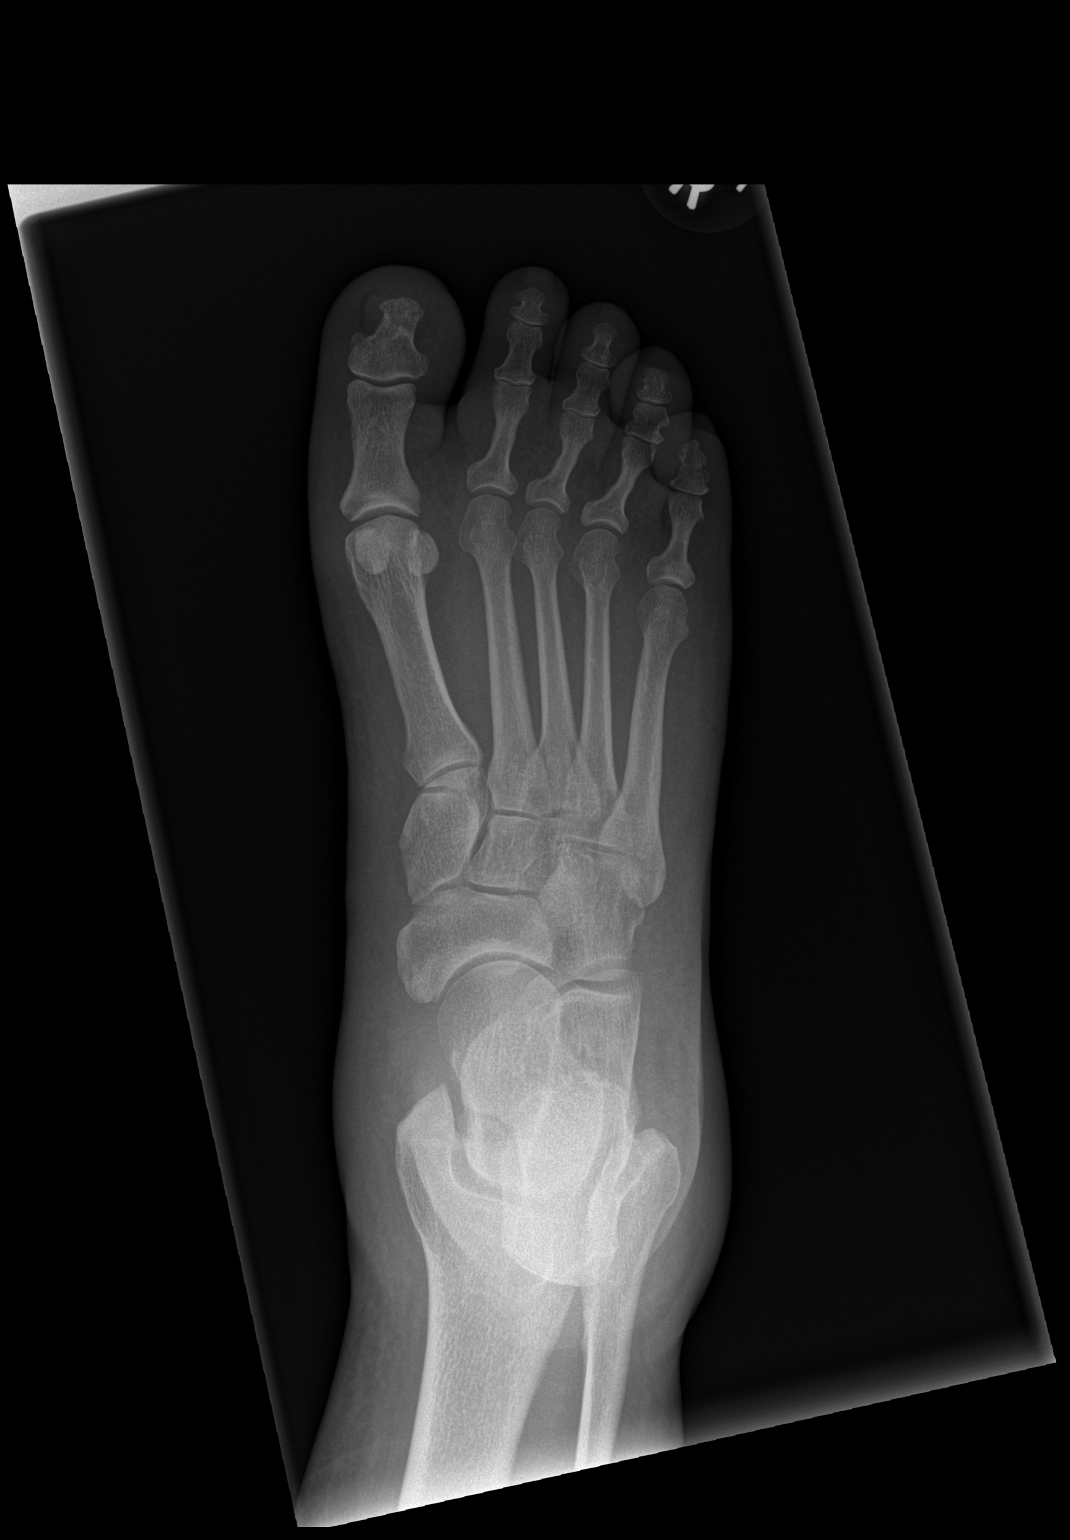

[x foot obl right]
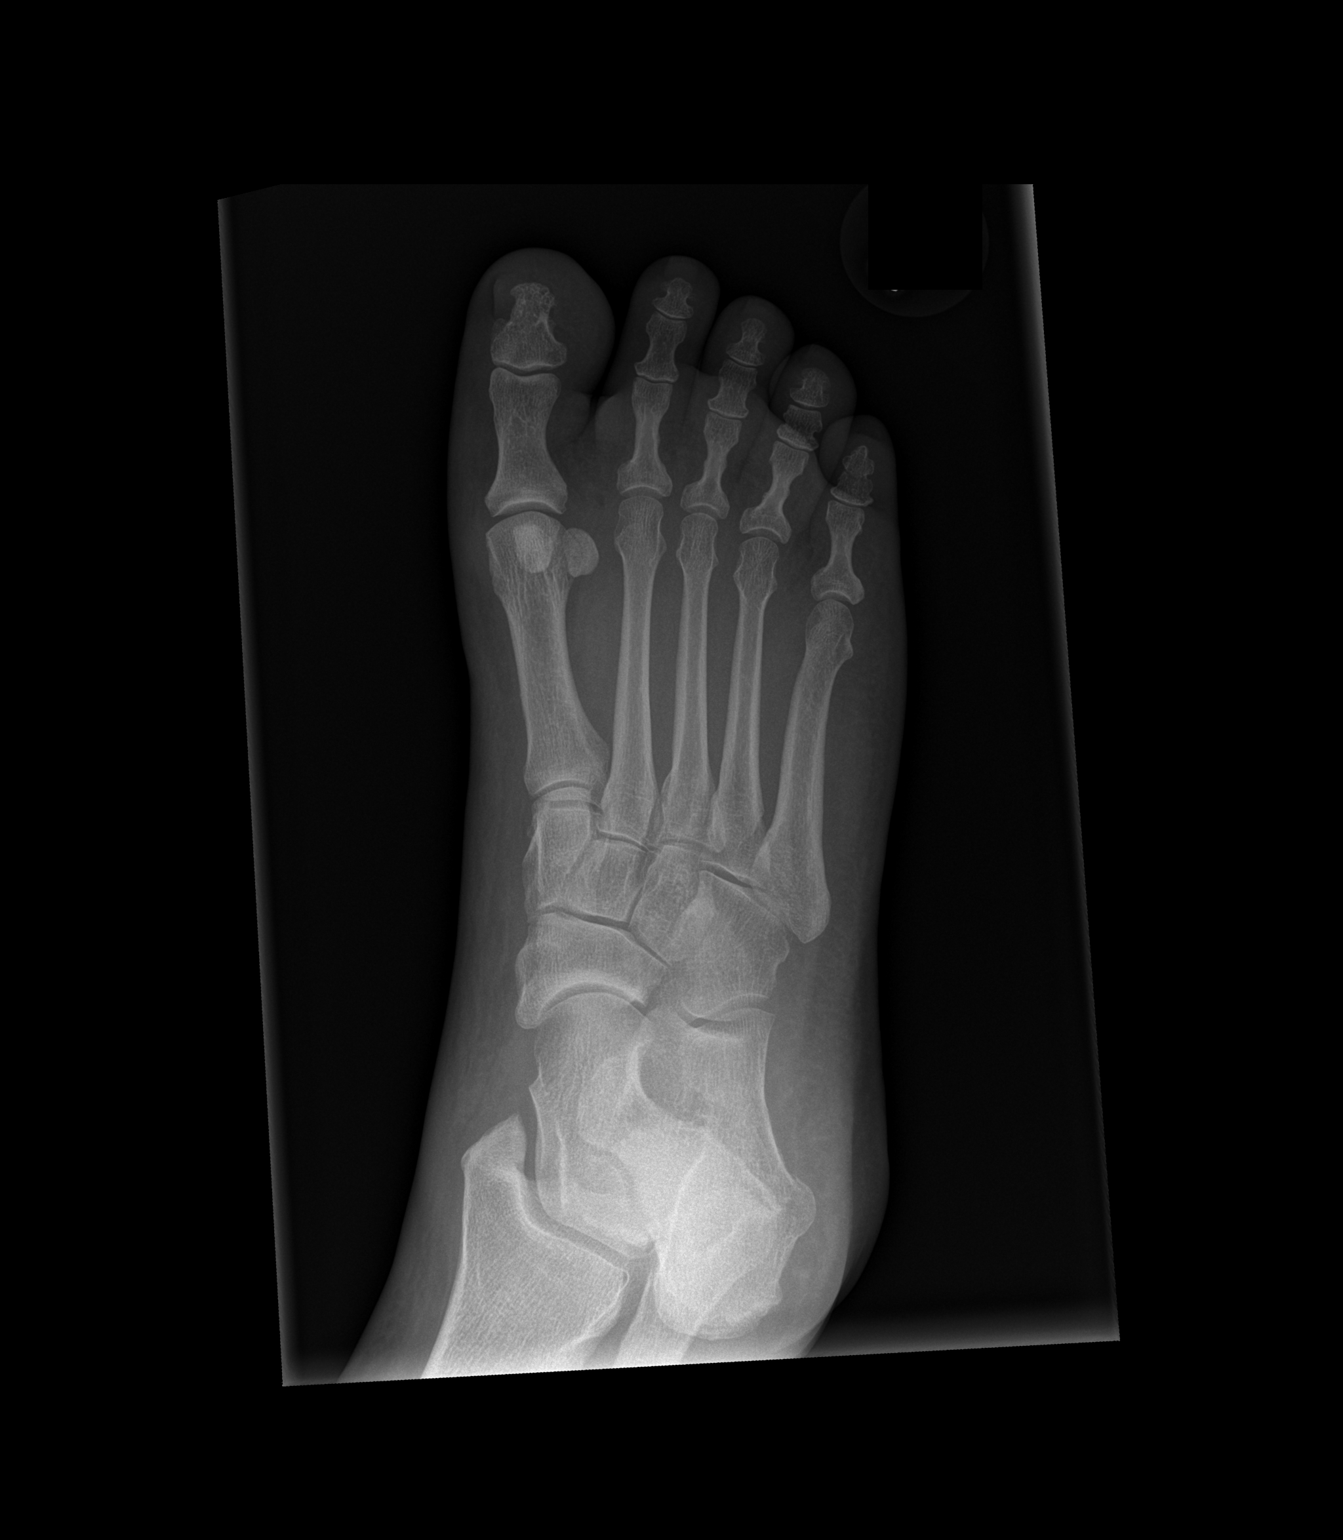

[x foot lat right]
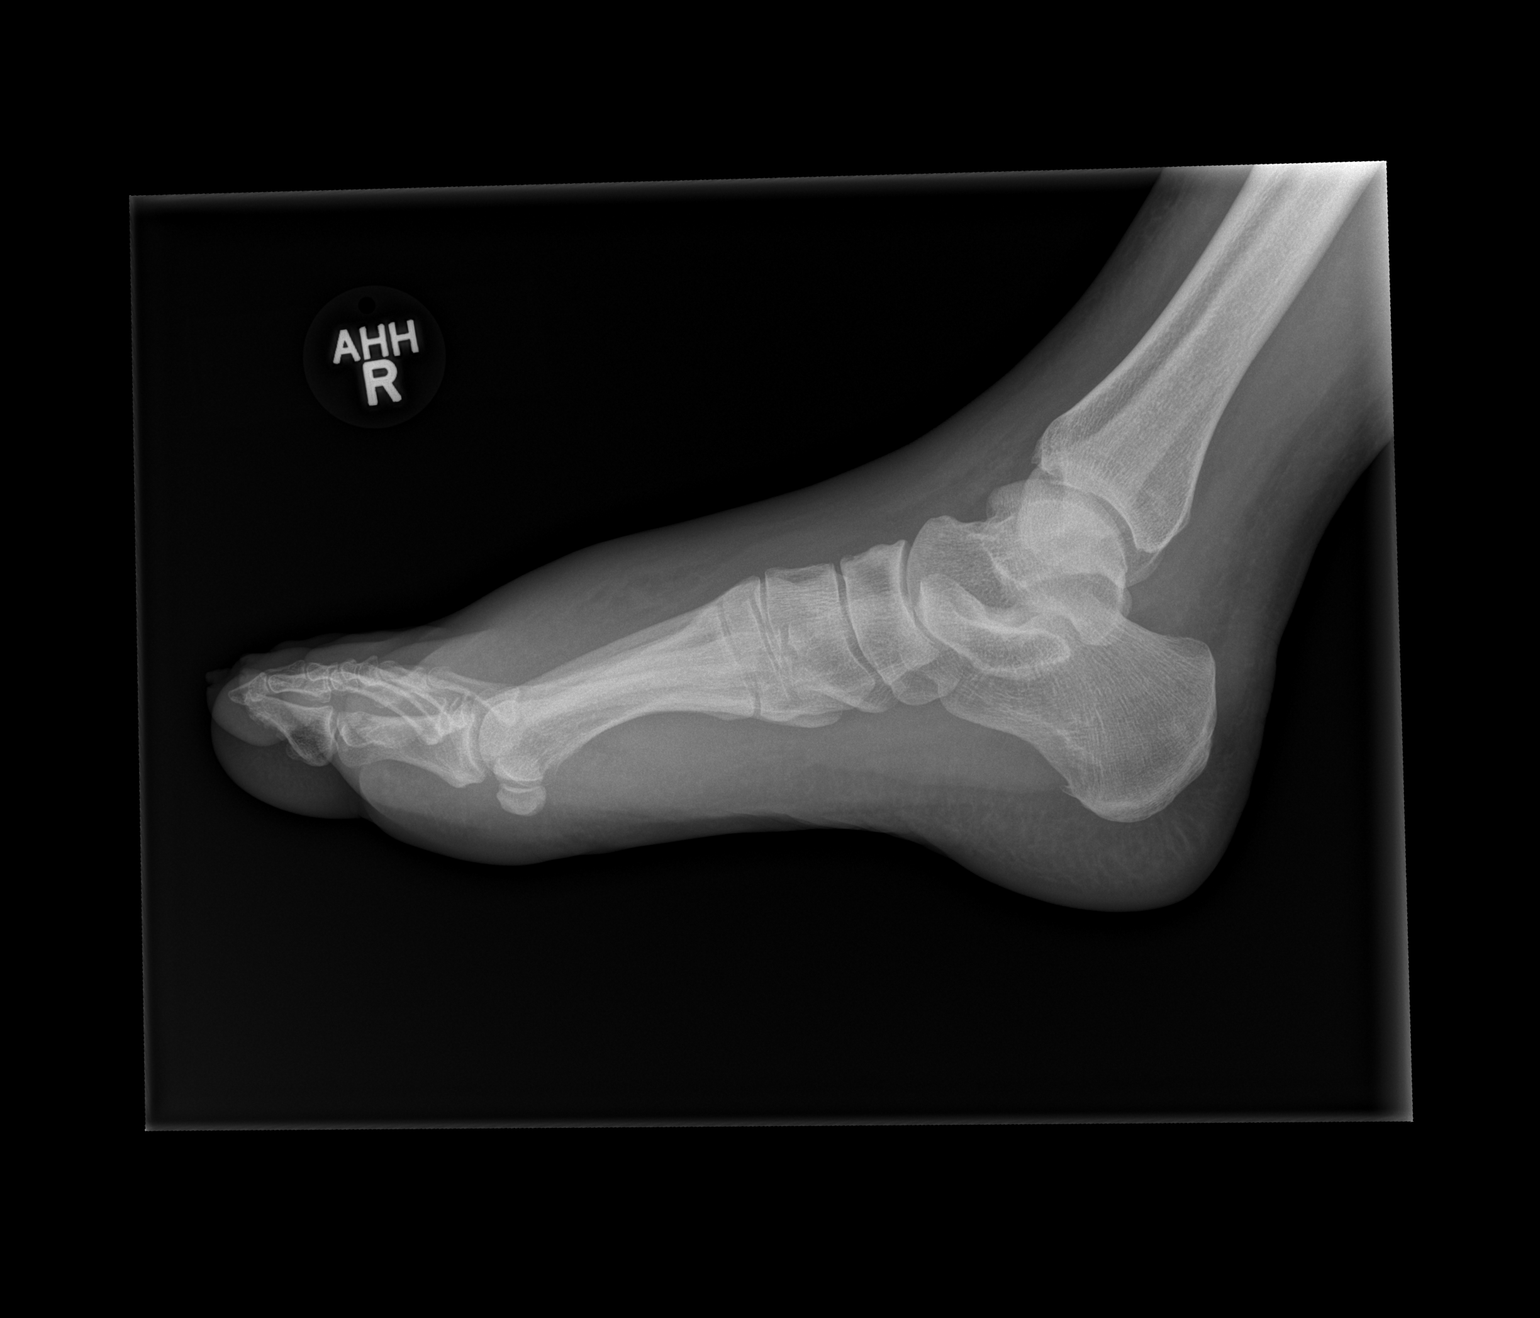

[3 of 3 positions shown; findings below may reference images not displayed]

FINDINGS: There is no evidence of fracture or dislocation. There is minimal
plantar calcaneal spur. There is soft tissue swelling of the mid
foot. No radiopaque foreign body is noted.
IMPRESSION: No acute fracture or dislocation. Generalized soft tissue swelling
of the midfoot.
